# Patient Record
Sex: Female | Born: 1975
Health system: Southern US, Community
[De-identification: ages and names within clinical notes are randomized; demographics above are authoritative.]

## PROBLEM LIST (undated history)

## (undated) DIAGNOSIS — F319 Bipolar disorder, unspecified: Secondary | ICD-10-CM

## (undated) DIAGNOSIS — F909 Attention-deficit hyperactivity disorder, unspecified type: Secondary | ICD-10-CM

## (undated) DIAGNOSIS — F419 Anxiety disorder, unspecified: Secondary | ICD-10-CM

## (undated) DIAGNOSIS — Z87898 Personal history of other specified conditions: Secondary | ICD-10-CM

## (undated) DIAGNOSIS — IMO0002 Reserved for concepts with insufficient information to code with codable children: Secondary | ICD-10-CM

## (undated) HISTORY — DX: Reserved for concepts with insufficient information to code with codable children: IMO0002

## (undated) HISTORY — DX: Bipolar disorder, unspecified: F31.9

## (undated) HISTORY — DX: Personal history of other specified conditions: Z87.898

---

## 1996-07-03 HISTORY — PX: TONSILLECTOMY: SUR1361

## 1996-07-03 HISTORY — PX: THROAT SURGERY: SHX803

## 1999-07-04 DIAGNOSIS — Z87898 Personal history of other specified conditions: Secondary | ICD-10-CM

## 1999-07-04 HISTORY — DX: Personal history of other specified conditions: Z87.898

## 1999-12-30 ENCOUNTER — Other Ambulatory Visit: Admission: RE | Admit: 1999-12-30 | Discharge: 1999-12-30 | Payer: Self-pay | Admitting: Obstetrics and Gynecology

## 2000-04-24 ENCOUNTER — Other Ambulatory Visit: Admission: RE | Admit: 2000-04-24 | Discharge: 2000-04-24 | Payer: Self-pay | Admitting: Obstetrics and Gynecology

## 2000-12-31 ENCOUNTER — Emergency Department (HOSPITAL_COMMUNITY): Admission: EM | Admit: 2000-12-31 | Discharge: 2000-12-31 | Payer: Self-pay | Admitting: Emergency Medicine

## 2000-12-31 ENCOUNTER — Encounter: Payer: Self-pay | Admitting: Emergency Medicine

## 2001-01-28 ENCOUNTER — Other Ambulatory Visit: Admission: RE | Admit: 2001-01-28 | Discharge: 2001-01-28 | Payer: Self-pay | Admitting: Obstetrics and Gynecology

## 2002-02-04 ENCOUNTER — Other Ambulatory Visit: Admission: RE | Admit: 2002-02-04 | Discharge: 2002-02-04 | Payer: Self-pay | Admitting: Obstetrics and Gynecology

## 2003-03-20 ENCOUNTER — Other Ambulatory Visit: Admission: RE | Admit: 2003-03-20 | Discharge: 2003-03-20 | Payer: Self-pay | Admitting: Obstetrics and Gynecology

## 2004-04-05 ENCOUNTER — Other Ambulatory Visit: Admission: RE | Admit: 2004-04-05 | Discharge: 2004-04-05 | Payer: Self-pay | Admitting: Obstetrics and Gynecology

## 2005-03-27 ENCOUNTER — Other Ambulatory Visit: Admission: RE | Admit: 2005-03-27 | Discharge: 2005-03-27 | Payer: Self-pay | Admitting: Nurse Practitioner

## 2005-04-14 ENCOUNTER — Other Ambulatory Visit: Admission: RE | Admit: 2005-04-14 | Discharge: 2005-04-14 | Payer: Self-pay | Admitting: Obstetrics and Gynecology

## 2005-12-01 HISTORY — PX: AUGMENTATION MAMMAPLASTY: SUR837

## 2006-07-03 DIAGNOSIS — F319 Bipolar disorder, unspecified: Secondary | ICD-10-CM

## 2006-07-03 HISTORY — DX: Bipolar disorder, unspecified: F31.9

## 2006-07-19 ENCOUNTER — Other Ambulatory Visit: Admission: RE | Admit: 2006-07-19 | Discharge: 2006-07-19 | Payer: Self-pay | Admitting: Obstetrics & Gynecology

## 2006-08-16 ENCOUNTER — Ambulatory Visit: Payer: Self-pay | Admitting: Gastroenterology

## 2007-09-02 ENCOUNTER — Other Ambulatory Visit: Admission: RE | Admit: 2007-09-02 | Discharge: 2007-09-02 | Payer: Self-pay | Admitting: Obstetrics and Gynecology

## 2008-09-24 ENCOUNTER — Other Ambulatory Visit: Admission: RE | Admit: 2008-09-24 | Discharge: 2008-09-24 | Payer: Self-pay | Admitting: Obstetrics and Gynecology

## 2008-10-12 ENCOUNTER — Other Ambulatory Visit: Admission: RE | Admit: 2008-10-12 | Discharge: 2008-10-12 | Payer: Self-pay | Admitting: Obstetrics & Gynecology

## 2010-01-26 ENCOUNTER — Emergency Department (HOSPITAL_COMMUNITY): Admission: EM | Admit: 2010-01-26 | Discharge: 2010-01-26 | Payer: Self-pay | Admitting: Family Medicine

## 2010-11-18 NOTE — Assessment & Plan Note (Signed)
[facility]                         GASTROENTEROLOGY OFFICE NOTE   TYNE, BANTA                       MRN:          [id number]  DATE:08/16/2006                            DOB:          08/03/1975    REFERRING PHYSICIAN:  Dr. Nada Boozer.   REASON FOR REFERRAL:  Lower abdominal pain and diarrhea.   HISTORY OF PRESENT ILLNESS:  Ms. Linda [name] is a very nice, 35 year old  white female, who relates problems for the past 3-4 months with  recurrent episodes of non-bloody, watery, diarrhea, and lower abdominal  crampy and burning pain.  About 2 months ago she had an episode of  nausea, vomiting, and diarrhea that was attributed to an intestinal  infection and required intravenous fluids.  She states she had a CT scan  performed in [place] about 2 months ago that was unremarkable.  A  CBC, CMET, amylase and lipase from August 03, 2006 were all entirely  normal.  She states her symptoms are episodic.  She may go a few days  without a problem, and then have several days in a row with ongoing pain  and diarrhea.  Occasionally, her symptoms last only a few hours,  occasionally they last 3-4 days.  She has not noted any mucous or blood  in her stool.  She does note frequent rumbling and noises from her  abdomen.  Her symptoms are not clearly exacerbated by any particular  activity, and do not appear to worsen with meals.  She has noted  intolerance to lactose products that began about a year ago, and she has  avoided lactose products since that time.  She takes only a minimal  amount of caffeine, and a modest amount of alcohol on social occasions.  Her weight is stable, her appetite is good.  She relates no recent  treatment with antibiotics and no recent medication or dietary changes.   PAST MEDICAL HISTORY:  Negative.   PAST SURGICAL HISTORY:  Status post bilateral breast augmentation.   CURRENT MEDICATIONS:  1. Birth control pills daily.  2. Multivitamin daily.  3. Tums p.r.n.  4. Zantac 150 p.r.n.   MEDICATION ALLERGIES:  PENICILLIN.   SOCIAL HISTORY:  She is divorced with no children.  She denies any  tobacco product usage and  drinks a modest amount of alcohol on social  occasions.  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   REVIEW OF SYSTEMS:  As per the handwritten form.   PHYSICAL EXAMINATION:  Well-developed, well-nourished, slight female in  no acute distress.  Height 5 feet 6 inches, weight 124.8 pounds.  Blood  pressure is 98/64, pulse 64 and regular.  HEENT:  Anicteric sclerae, oropharynx clear.  CHEST:  Clear to auscultation bilaterally.  CARDIAC:  Regular rate and rhythm without murmurs appreciated.  ABDOMEN:  Soft with minimal lower abdominal tenderness to deep  palpation, no rebound or guarding, no palpable organomegaly, masses, or  hernias.  EXTREMITIES:  Without cyanosis, clubbing, or edema.  NEUROLOGIC:  Alert and oriented x3.  Grossly nonfocal.   ASSESSMENT AND PLAN:  Diarrhea with associated lower abdominal  pain.  Rule out inflammatory bowel disease, irritable bowel syndrome and  intestinal infections.  Will obtain records from her evaluation at  [facility], including the results of the CT scan.  Obtain stool  hemoccults.  Trial of Cipro 500 mg b.i.d., and Flagyl 500 mg b.i.d.,  both for 7 days empirically.  Robinul 1 b.i.d.  Return office visit in 2-  3 weeks for followup.  Consider further evaluation with blood work for  celiac disease serology, thyroid function, and an erythrocyte  sedimentation rate if her symptoms have not resolved.  Consider  colonoscopy as well if her symptoms have not resolved.     Venita Lick. Russella Dar, MD, [facility]  Electronically Signed    MTS/MedQ  DD: 08/16/2006  DT: 08/16/2006  Job #: [id number]

## 2011-11-03 LAB — HM PAP SMEAR: HM Pap smear: NEGATIVE

## 2012-04-23 ENCOUNTER — Emergency Department (HOSPITAL_COMMUNITY)
Admission: EM | Admit: 2012-04-23 | Discharge: 2012-04-23 | Discharge status: Against Medical Advice | Payer: Self-pay | Attending: Emergency Medicine | Admitting: Emergency Medicine

## 2012-04-23 ENCOUNTER — Encounter (HOSPITAL_COMMUNITY): Payer: Self-pay | Admitting: Cardiology

## 2012-04-23 DIAGNOSIS — R1084 Generalized abdominal pain: Secondary | ICD-10-CM | POA: Insufficient documentation

## 2012-04-23 LAB — URINALYSIS, ROUTINE W REFLEX MICROSCOPIC
Bilirubin Urine: NEGATIVE
Glucose, UA: NEGATIVE mg/dL
Hgb urine dipstick: NEGATIVE
Ketones, ur: NEGATIVE mg/dL
Leukocytes, UA: NEGATIVE
Nitrite: NEGATIVE
Protein, ur: NEGATIVE mg/dL
Specific Gravity, Urine: 1.006 (ref 1.005–1.030)
Urobilinogen, UA: 0.2 mg/dL (ref 0.0–1.0)
pH: 6.5 (ref 5.0–8.0)

## 2012-04-23 LAB — POCT PREGNANCY, URINE: Preg Test, Ur: NEGATIVE

## 2012-04-23 NOTE — ED Notes (Signed)
Reports generalized abd pain that is sharp and stabbing in nature that started this am. Denies any n/v, sob or chest pain. Denies urinary symptoms.

## 2013-03-07 ENCOUNTER — Encounter: Payer: Self-pay | Admitting: Nurse Practitioner

## 2013-03-07 ENCOUNTER — Ambulatory Visit (INDEPENDENT_AMBULATORY_CARE_PROVIDER_SITE_OTHER): Payer: BC Managed Care – PPO | Admitting: Nurse Practitioner

## 2013-03-07 VITALS — BP 100/60 | HR 68 | Resp 16 | Ht 65.0 in | Wt 145.0 lb

## 2013-03-07 DIAGNOSIS — Z01419 Encounter for gynecological examination (general) (routine) without abnormal findings: Secondary | ICD-10-CM

## 2013-03-07 DIAGNOSIS — Z0000 Encounter for general adult medical examination without abnormal findings: Secondary | ICD-10-CM

## 2013-03-07 LAB — HEMOGLOBIN, FINGERSTICK: Hemoglobin, fingerstick: 14.2 g/dL (ref 12.0–16.0)

## 2013-03-07 NOTE — Progress Notes (Signed)
Patient ID: Linda [name], female   DOB: [birth year], 37 y.o.   MRN: [id number] 37 y.o. G1P0010 Married Caucasian Fe here for annual exam.  Saw Dr Jamse Arn last year and had 4 IUI, 2 IVF. Got pregnant with 1st IVF which was considered a 'chemical' pregnancy which ended within 2-3 weeks. Currently taking the summer off. Going now to go see [facility] and saw Dr. Elesa Hacker for first appointment.  Will have 3 rd IVF later this fall.  Currently on OCP and DHEA 25 mg tid  The  AMH level was low at 1.04. Plan is to do injections and by the end of September will get egg retrieval and then another IVF. Now married for 3 years and husband is supportive.  Patient's last menstrual period was 02/28/2013.          Sexually active: yes  The current method of family planning is none.    Exercising: yes  Home exercise routine includes Home routine that includes walking, running, and workouts with husband.  also doing Pure [employer]. Smoker:  no  Health Maintenance: Pap:  11/09/11, WNL, neg HR HPV TDaP:  11/03/11 Labs: HB: 14.2  Urine: negative    reports that she has never smoked. She has never used smokeless tobacco. She reports that  drinks alcohol. She reports that she does not use illicit drugs.  Past Medical History  Diagnosis Date  . Bipolar disorder   . History of abnormal Pap smear 1/01    mild dysplasia, HPV  . Infertility     Past Surgical History  Procedure Laterality Date  . Tonsillectomy    . Augmentation mammaplasty Bilateral 6/07    implants  . Throat surgery  1998    throat absess    Current Outpatient Prescriptions  Medication Sig Dispense Refill  . clomiPHENE (CLOMID) 50 MG tablet Take 50 mg by mouth daily.      . simethicone (MYLICON) 125 MG chewable tablet Chew 125 mg by mouth every 6 (six) hours as needed. Gas pain       No current facility-administered medications for this visit.    Family History  Problem Relation Age of Onset  . Hyperlipidemia Mother   . Cancer Maternal Aunt      esophageal  . Diabetes Maternal Grandfather   . Cancer Paternal Grandmother     melanoma  . Dementia Paternal Grandmother   . Cancer Paternal Grandfather     lung    ROS:  Pertinent items are noted in HPI.  Otherwise, a comprehensive ROS was negative.  Exam:   BP 100/60  Pulse 68  Resp 16  Ht 5\' 5"  (1.651 m)  Wt 145 lb (65.772 kg)  BMI 24.13 kg/m2  LMP 02/28/2013 Height: 5\' 5"  (165.1 cm)  Ht Readings from Last 3 Encounters:  03/07/13 5\' 5"  (1.651 m)    General appearance: alert, cooperative and appears stated age Head: Normocephalic, without obvious abnormality, atraumatic Neck: no adenopathy, supple, symmetrical, trachea midline and thyroid normal to inspection and palpation Lungs: clear to auscultation bilaterally Breasts: normal appearance, no masses or tenderness Heart: regular rate and rhythm Abdomen: soft, non-tender; no masses,  no organomegaly Extremities: extremities normal, atraumatic, no cyanosis or edema Skin: Skin color, texture, turgor normal. No rashes or lesions Lymph nodes: Cervical, supraclavicular, and axillary nodes normal. No abnormal inguinal nodes palpated Neurologic: Grossly normal   Pelvic: External genitalia:  no lesions              Urethra:  normal  appearing urethra with no masses, tenderness or lesions              Bartholin's and Skene's: normal                 Vagina: normal appearing vagina with normal color and discharge, no lesions              Cervix: anteverted              Pap taken: yes per request from [facility] Bimanual Exam:  Uterus:  normal size, contour, position, consistency, mobility, non-tender              Adnexa: no mass, fullness, tenderness               Rectovaginal: Confirms               Anus:  normal sphincter tone, no lesions  A:  Well Woman with normal exam  Infertility evaluation  Remote history of dyplasia with HPV 2001 normal since.  Situational anxiety with history of Bipolar and not on  med's.  P:   Pap smear as per guidelines   Continued evaluation with infertility  She will contact them if med's are needed for anxiety  Counseled on breast self exam, adequate intake of calcium and vitamin D, diet and exercise return annually or prn  An After Visit Summary was printed and given to the patient.

## 2013-03-07 NOTE — Patient Instructions (Signed)

## 2013-03-08 NOTE — Progress Notes (Signed)
Encounter reviewed by Dr. Cyncere Sontag Silva.  

## 2013-03-11 LAB — IPS PAP TEST WITH REFLEX TO HPV

## 2013-05-08 ENCOUNTER — Other Ambulatory Visit: Payer: Self-pay

## 2013-11-18 ENCOUNTER — Telehealth: Payer: Self-pay | Admitting: Nurse Practitioner

## 2013-11-18 NOTE — Telephone Encounter (Signed)
Patient calling to speak with nurse about getting an appointment for "a lump" under her right arm in her armpit. Please advise?

## 2013-11-18 NOTE — Telephone Encounter (Signed)
Spoke with patient at time of incoming call. Patient states she has R axillary lump that has been ongoing x 1 month. Not painful or red. No trauma or bites to arm. Feels that it is increasing in size. Requests late afternoon appointment. Office visit scheduled for Milford Cage, [place] on 11/20/13 at 1615. Patient agreeable. Will call back if symptoms worsen prior to appointment.   Routing to provider for final review. Patient agreeable to disposition. Will close encounter

## 2013-11-20 ENCOUNTER — Encounter: Payer: Self-pay | Admitting: Nurse Practitioner

## 2013-11-20 ENCOUNTER — Ambulatory Visit (INDEPENDENT_AMBULATORY_CARE_PROVIDER_SITE_OTHER): Payer: BC Managed Care – PPO | Admitting: Nurse Practitioner

## 2013-11-20 ENCOUNTER — Other Ambulatory Visit (HOSPITAL_COMMUNITY): Payer: Self-pay | Admitting: Diagnostic Radiology

## 2013-11-20 VITALS — BP 100/64 | HR 60 | Ht 65.0 in | Wt 146.0 lb

## 2013-11-20 DIAGNOSIS — R599 Enlarged lymph nodes, unspecified: Secondary | ICD-10-CM

## 2013-11-20 DIAGNOSIS — R590 Localized enlarged lymph nodes: Secondary | ICD-10-CM

## 2013-11-20 LAB — CBC WITH DIFFERENTIAL/PLATELET
Basophils Absolute: 0 10*3/uL (ref 0.0–0.1)
Basophils Relative: 0 % (ref 0–1)
Eosinophils Absolute: 0.1 10*3/uL (ref 0.0–0.7)
Eosinophils Relative: 1 % (ref 0–5)
HCT: 40.9 % (ref 36.0–46.0)
Hemoglobin: 14 g/dL (ref 12.0–15.0)
Lymphocytes Relative: 23 % (ref 12–46)
Lymphs Abs: 2.3 10*3/uL (ref 0.7–4.0)
MCH: 31 pg (ref 26.0–34.0)
MCHC: 34.2 g/dL (ref 30.0–36.0)
MCV: 90.5 fL (ref 78.0–100.0)
Monocytes Absolute: 0.9 10*3/uL (ref 0.1–1.0)
Monocytes Relative: 9 % (ref 3–12)
Neutro Abs: 6.8 10*3/uL (ref 1.7–7.7)
Neutrophils Relative %: 67 % (ref 43–77)
Platelets: 261 10*3/uL (ref 150–400)
RBC: 4.52 MIL/uL (ref 3.87–5.11)
RDW: 12.6 % (ref 11.5–15.5)
WBC: 10.1 10*3/uL (ref 4.0–10.5)

## 2013-11-20 NOTE — Progress Notes (Signed)
Patient scheduled while in office for R Axillary [place] at [place] [address]. Patient agreeable to time/date/location.

## 2013-11-20 NOTE — Progress Notes (Signed)
Subjective:     Patient ID: Linda [name], female   DOB: May 30, 1976, 38 y.o.   MRN: [id number]  HPI  This 38 yo G1,P0.A1 WM Fe presents with axillary lymph node or swelling of right axilla about a month ago.  She initially thought this was related to an ingrown hair or cyst.  But no exudate or pain.  She has gradually noted that selling is still there without improvement of symptoms.  No history of trauma or bites.  No rashes and no other lymph nodes that feel enlarged.  No recent hormonal therapy to obtain a pregnancy since August 2014.  She had 2 previous IVF and 4 IUI.     Review of Systems  Constitutional: Negative.   HENT: Negative.   Respiratory: Negative.   Cardiovascular: Negative.   Genitourinary: Negative.   Musculoskeletal: Negative.   Neurological: Negative.   Psychiatric/Behavioral: Negative.        Objective:   Physical Exam  Constitutional: She is oriented to person, place, and time. She appears well-developed and well-nourished. No distress.  Abdominal: Soft.  Lymphadenopathy:       Right cervical: No superficial cervical, no deep cervical and no posterior cervical adenopathy present.      Left cervical: No superficial cervical, no deep cervical and no posterior cervical adenopathy present.    She has axillary adenopathy.       Right axillary: Pectoral adenopathy present.       Left axillary: No pectoral and no lateral adenopathy present. Right axilla with < than 1 cm mobile non tender lymph node that is high in the axilla.  No rash, no lesions, or bites.  Breast exam is normal. Without mass or nipple discharge.  Neurological: She is alert and oriented to person, place, and time.  Skin: Skin is warm and dry.  Psychiatric: She has a normal mood and affect. Her behavior is normal. Judgment and thought content normal.       Assessment:     Right Axillary lymph node    Plan:     patient was seen in consultation with Dr. Sabra Heck Will get Korea of right Axilla and  follow Will get CBC and follow.

## 2013-11-25 ENCOUNTER — Ambulatory Visit
Admission: RE | Admit: 2013-11-25 | Discharge: 2013-11-25 | Disposition: A | Discharge status: Home / Self Care | Payer: BC Managed Care – PPO | Source: Ambulatory Visit | Attending: Nurse Practitioner | Admitting: Nurse Practitioner

## 2013-11-25 ENCOUNTER — Encounter: Payer: Self-pay | Admitting: Nurse Practitioner

## 2013-11-25 DIAGNOSIS — R590 Localized enlarged lymph nodes: Secondary | ICD-10-CM

## 2013-11-25 NOTE — Progress Notes (Signed)
Reviewed personally.  M. Suzanne Mattie Novosel, MD.  

## 2013-12-10 ENCOUNTER — Telehealth: Payer: Self-pay | Admitting: Emergency Medicine

## 2013-12-10 NOTE — Telephone Encounter (Signed)
Message copied by Michele Mcalpine on Wed Dec 10, 2013  9:03 AM ------      Message from: Megan Salon      Created: Wed Dec 03, 2013  8:32 AM       Inform pt ultrasound showed one, non worrisome appearing lymph node.  Out of hold.  Needs repeat physical exam in 6 weeks. ------

## 2013-12-12 NOTE — Telephone Encounter (Signed)
Spoke with patient. She is given message from Dr. Sabra Heck and agreeable to recheck with Linda [name], [place]. Scheduled office visit for 6/29 at 1245.   Routing to provider for final review. Patient agreeable to disposition. Will close encounter

## 2013-12-12 NOTE — Telephone Encounter (Signed)
Message left to return call to [place] at [phone].   Will need follow up exam scheduled.

## 2013-12-12 NOTE — Telephone Encounter (Signed)
Patient is calling Linda [name] back

## 2013-12-29 ENCOUNTER — Encounter: Payer: Self-pay | Admitting: Nurse Practitioner

## 2013-12-29 ENCOUNTER — Ambulatory Visit (INDEPENDENT_AMBULATORY_CARE_PROVIDER_SITE_OTHER): Payer: BC Managed Care – PPO | Admitting: Nurse Practitioner

## 2013-12-29 VITALS — BP 118/70 | HR 60 | Ht 65.5 in | Wt 146.0 lb

## 2013-12-29 DIAGNOSIS — R599 Enlarged lymph nodes, unspecified: Secondary | ICD-10-CM

## 2013-12-29 NOTE — Progress Notes (Signed)
Reviewed personally.  M. Suzanne Miller, MD.  

## 2013-12-29 NOTE — Patient Instructions (Signed)
Continue to monitor axilla area and will recheck at annual.  If desires to remove wil see breast surgeon.

## 2013-12-29 NOTE — Progress Notes (Signed)
Patient ID: Linda [name], female   DOB: [birth year], 38 y.o.   MRN: [id number] S: HPI This 38 yo G1,P0.A1 WM Fe presents with axillary lymph node or swelling of right axilla for about 2 months ago. She initially thought this was related to an ingrown hair or cyst. But no exudate or pain. She came in for evaluation on 5/21 and was also examined by Dr. Sabra Heck.  We decided to get an Korea which showed a 1.1X 0.5 X 0.3 cm lymph node with fatty hilum.  This area feels about the same.  Has not gone away, but causing no pain or tenderness.  No history of trauma or bites. No rashes and no other lymph nodes that feel enlarged.  O: Right axilla without much change in the size of the lymph node. No surrounding inflammation or swelling.  No tender.  No breast mass and no other nodes.  A:   Right axillary lymph node - stable  Plan: Consulted with Dr. Sabra Heck again - if she desires this to be removed then will get breast surgeon to see her.  She feels comfortable with watchful waiting and re exam at [facility] in the fall.in the interim if any concerns to call back.

## 2014-03-11 ENCOUNTER — Ambulatory Visit: Payer: BC Managed Care – PPO | Admitting: Nurse Practitioner

## 2014-04-17 ENCOUNTER — Other Ambulatory Visit: Payer: Self-pay

## 2014-05-04 ENCOUNTER — Encounter: Payer: Self-pay | Admitting: Nurse Practitioner

## 2014-11-13 ENCOUNTER — Telehealth: Payer: Self-pay | Admitting: Nurse Practitioner

## 2014-11-13 NOTE — Telephone Encounter (Signed)
Spoke with patient. Patient states that she was seen last May for an enlarged lymph node under her right arm. Was sent for ultrasound on 11/28/2013 which was normal. Patient feels the area has increased in size and is more tender than usual. Denies any redness to the area or warmth. Area is sore to the touch. "I just want to make sure I have it looked at since it feels different now." Patient declines appointment until Friday of next week due to work schedule. Appointment scheduled for 5/20 at 9:15am with Milford Cage, [place]. Patient is agreeable to date and time. Will return call to be seen sooner if symptoms worsen or develops any new symptoms.   Routing to provider for final review. Patient agreeable to disposition. Patient aware provider will review message and nurse will return call with any additional instructions or change of disposition. Will close encounter.

## 2014-11-13 NOTE — Telephone Encounter (Signed)
Patient is a having a breast problem and has an aex 01/26/15 with [facility]. (next available) Patient wanted to wait to see [facility] for her breast problem until her aex. Patient agreed to have a nurse call her.

## 2014-11-20 ENCOUNTER — Ambulatory Visit (INDEPENDENT_AMBULATORY_CARE_PROVIDER_SITE_OTHER): Payer: BLUE CROSS/BLUE SHIELD | Admitting: Nurse Practitioner

## 2014-11-20 ENCOUNTER — Encounter: Payer: Self-pay | Admitting: Nurse Practitioner

## 2014-11-20 VITALS — BP 116/64 | HR 56 | Ht 65.0 in | Wt 150.0 lb

## 2014-11-20 DIAGNOSIS — M79621 Pain in right upper arm: Secondary | ICD-10-CM

## 2014-11-20 DIAGNOSIS — Z01419 Encounter for gynecological examination (general) (routine) without abnormal findings: Secondary | ICD-10-CM

## 2014-11-20 DIAGNOSIS — Z0000 Encounter for general adult medical examination without abnormal findings: Secondary | ICD-10-CM | POA: Diagnosis not present

## 2014-11-20 LAB — POCT URINALYSIS DIPSTICK
Bilirubin, UA: NEGATIVE
Blood, UA: NEGATIVE
Glucose, UA: NEGATIVE
Ketones, UA: NEGATIVE
Leukocytes, UA: NEGATIVE
Nitrite, UA: NEGATIVE
Protein, UA: NEGATIVE
Urobilinogen, UA: NEGATIVE
pH, UA: 5

## 2014-11-20 NOTE — Progress Notes (Signed)
Patient ID: Linda [name], female   DOB: 04-02-1976, 40 y.o.   MRN: [id number] 39 y.o. G35P0010 Married  Caucasian Fe here for annual exam.  Last saw Dr. Rolin Barry over a year ago and is not going to pursue other infertility evaluation or test as there seems to be nothing that can help.  She had 4 IUI and 2 IVS. with Dr. Carmela Rima.  The last IVS was with Dr. Rolin Barry.   Menses in regular and flow for 2-3 days. Some cramps and PMS. Still has right axilla lymph node pain since last seen here 10/2013 and 12/2013.  She is not worried but does not seem to get smaller and intermittently is more tender.  Patient's last menstrual period was 10/23/2014 (exact date).          Sexually active: Yes.    The current method of family planning is none.    Exercising: No.  The patient does not participate in regular exercise at present. Smoker:  no  Health Maintenance: Pap:  03/07/13, negative TDaP:  11/03/11 Labs:  HB:  14.1   Urine:  Negative    reports that she has never smoked. She has never used smokeless tobacco. She reports that she drinks alcohol. She reports that she does not use illicit drugs.  Past Medical History  Diagnosis Date  . Bipolar disorder 2008  . History of abnormal Pap smear 1/01    mild dysplasia, HPV  . Infertility     Past Surgical History  Procedure Laterality Date  . Tonsillectomy  1998  . Augmentation mammaplasty Bilateral 6/07    implants  . Throat surgery  1998    throat absess    No current outpatient prescriptions on file.   No current facility-administered medications for this visit.    Family History  Problem Relation Age of Onset  . Hyperlipidemia Mother   . COPD Mother   . Esophageal cancer Maternal Aunt 52    esophageal, smoker  . Diabetes Maternal Grandfather   . Cancer Paternal Grandmother     melanoma  . Dementia Paternal Grandmother   . Lung cancer Paternal Grandfather     lung    ROS:  Pertinent items are noted in HPI.  Otherwise, a comprehensive ROS was  negative.  Exam:   BP 116/64 mmHg  Pulse 56  Ht 5\' 5"  (1.651 m)  Wt 150 lb (68.04 kg)  BMI 24.96 kg/m2  LMP 10/23/2014 (Exact Date) Height: 5\' 5"  (165.1 cm) Ht Readings from Last 3 Encounters:  11/20/14 5\' 5"  (1.651 m)  12/29/13 5' 5.5" (1.664 m)  11/20/13 5\' 5"  (1.651 m)    General appearance: alert, cooperative and appears stated age Head: Normocephalic, without obvious abnormality, atraumatic Neck: no adenopathy, supple, symmetrical, trachea midline and thyroid normal to inspection and palpation Lungs: clear to auscultation bilaterally Breasts: normal appearance, no masses or tenderness, positive findings: implants bilaterally and tender lymph node right axilla that is unchanged < 1cm high in the axilla.  No exudat or redness.  Heart: regular rate and rhythm Abdomen: soft, non-tender; no masses,  no organomegaly Extremities: extremities normal, atraumatic, no cyanosis or edema Skin: Skin color, texture, turgor normal. No rashes or lesions Lymph nodes: Cervical, supraclavicular, and axillary nodes normal. No abnormal inguinal nodes palpated Neurologic: Grossly normal   Pelvic: External genitalia:  no lesions              Urethra:  normal appearing urethra with no masses, tenderness or lesions  Bartholin's and Skene's: normal                 Vagina: normal appearing vagina with normal color and discharge, no lesions              Cervix: anteverted              Pap taken: Yes.   Bimanual Exam:  Uterus:  normal size, contour, position, consistency, mobility, non-tender              Adnexa: no mass, fullness, tenderness               Rectovaginal: Confirms               Anus:  normal sphincter tone, no lesions  Chaperone present:  yes  A:  Well Woman with normal exam  Infertility evaluation - now ended Remote history of dyplasia with HPV 2001 normal since. Situational anxiety with history of Bipolar and not on med's  Right axilla  lymphadenopathy persistent    P:   Reviewed health and wellness pertinent to exam  Pap smear as above  Mammogram - will get diagnostic Mammo 3 D and Korea of right axilla - scheduled for  11/25/14 at the [facility]  It has been discussed that she may want to see a breast surgeon for removal.  Counseled on breast self exam, mammography screening, adequate intake of calcium and vitamin D, diet and exercise return annually or prn  An After Visit Summary was printed and given to the patient.

## 2014-11-20 NOTE — Progress Notes (Signed)
Patient is scheduled for Bilateral 3d Breast Diagnostic Mammogram and R Breast Ultrasound at [facility] on 11/25/14 at 0900 . Patient agreeable to time/date/location.

## 2014-11-20 NOTE — Patient Instructions (Signed)

## 2014-11-22 NOTE — Progress Notes (Signed)
Encounter reviewed by Dr. Brook Silva.  

## 2014-11-23 LAB — HEMOGLOBIN, FINGERSTICK: Hemoglobin, fingerstick: 14.1 g/dL (ref 12.0–16.0)

## 2014-11-25 ENCOUNTER — Ambulatory Visit
Admission: RE | Admit: 2014-11-25 | Discharge: 2014-11-25 | Disposition: A | Discharge status: Home / Self Care | Payer: BLUE CROSS/BLUE SHIELD | Source: Ambulatory Visit | Attending: Nurse Practitioner | Admitting: Nurse Practitioner

## 2014-11-25 ENCOUNTER — Other Ambulatory Visit: Payer: Self-pay | Admitting: Nurse Practitioner

## 2014-11-25 DIAGNOSIS — N6320 Unspecified lump in the left breast, unspecified quadrant: Secondary | ICD-10-CM

## 2014-11-25 DIAGNOSIS — M79621 Pain in right upper arm: Secondary | ICD-10-CM

## 2014-11-25 LAB — IPS PAP TEST WITH HPV

## 2015-01-26 ENCOUNTER — Ambulatory Visit: Payer: Self-pay | Admitting: Nurse Practitioner

## 2015-06-07 HISTORY — PX: OTHER SURGICAL HISTORY: SHX169

## 2015-06-09 ENCOUNTER — Other Ambulatory Visit: Payer: Self-pay

## 2015-06-09 ENCOUNTER — Ambulatory Visit (INDEPENDENT_AMBULATORY_CARE_PROVIDER_SITE_OTHER): Payer: BLUE CROSS/BLUE SHIELD | Admitting: Obstetrics and Gynecology

## 2015-06-09 ENCOUNTER — Encounter: Payer: Self-pay | Admitting: Obstetrics and Gynecology

## 2015-06-09 ENCOUNTER — Other Ambulatory Visit: Payer: Self-pay | Admitting: Obstetrics and Gynecology

## 2015-06-09 VITALS — BP 100/68 | HR 76 | Resp 18 | Ht 65.0 in | Wt 147.0 lb

## 2015-06-09 DIAGNOSIS — R2231 Localized swelling, mass and lump, right upper limb: Secondary | ICD-10-CM

## 2015-06-09 DIAGNOSIS — N63 Unspecified lump in unspecified breast: Secondary | ICD-10-CM

## 2015-06-09 NOTE — Progress Notes (Signed)
Scheduled patient while in office for left breast diagnostic mammogram and [facility] on [facility] at 1:30 pm. Patient expects to know if she is pregnant by 06/15/2015. The [facility] states they have to schedule the follow up diagnostic with the right axillary ultrasound and can not schedule these separate. [facility] is aware if the patient has positive pregnancy test on Tuesday she will NOT be able to have diagnostic imaging. Per [facility] if patient is pregnant diagnotic imaging will be cancelled and only the ultrasound will be performed.

## 2015-06-09 NOTE — Progress Notes (Signed)
GYNECOLOGY  VISIT   HPI: 39 y.o.   Married  caucasian  female   G1P0010 with Patient's last menstrual period was 04/17/2015 (approximate).   here for  Complaints of lump under right axillary. The patient just underwent an IVF transfer 2 days ago with a donor egg. She is on estradiol oral and transdermal and progesterone.  She has c/o a tender lump in her right axilla in the past, negative imaging in 5/16. She is due for a f/u diagnostic imaging of the left breast now.  The axillary lump has been there for over a year, feels it quadrupled in size in the last few weeks. More tender.   GYNECOLOGIC HISTORY: Patient's last menstrual period was 04/17/2015 (approximate). Contraception:None Menopausal hormone therapy: None Last mammogram: 11/25/2014 BIRADS category 3: probably benign Last pap smear: 11/20/2014 Normal        OB History    Gravida Para Term Preterm AB TAB SAB Ectopic Multiple Living   1 0 0  1  1            There are no active problems to display for this patient.   Past Medical History  Diagnosis Date  . Bipolar disorder ([place]) 2008  . History of abnormal Pap smear 1/01    mild dysplasia, HPV  . Infertility     Past Surgical History  Procedure Laterality Date  . Tonsillectomy  1998  . Augmentation mammaplasty Bilateral 6/07    implants  . Throat surgery  1998    throat absess  . Ivf transfer 06/07/2015 N/A 06/07/2015    Current Outpatient Prescriptions  Medication Sig Dispense Refill  . estradiol (ESTRACE) 2 MG tablet Take 2 mg by mouth.    . estradiol (VIVELLE-DOT) 0.1 MG/24HR patch Place 1 patch onto the skin.    . progesterone 50 MG/ML injection Inject into the muscle.     No current facility-administered medications for this visit.     ALLERGIES: Penicillins  Family History  Problem Relation Age of Onset  . Hyperlipidemia Mother   . COPD Mother   . Esophageal cancer Maternal Aunt 52    esophageal, smoker  . Diabetes Maternal Grandfather   .  Cancer Paternal Grandmother     melanoma  . Dementia Paternal Grandmother   . Lung cancer Paternal Grandfather     lung    Social History   Social History  . Marital Status: Married    Spouse Name: N/A  . Number of Children: 0  . Years of Education: N/A   Occupational History  . Not on file.   Social History Main Topics  . Smoking status: Never Smoker   . Smokeless tobacco: Never Used  . Alcohol Use: Yes     Comment: social  . Drug Use: No  . Sexual Activity:    Partners: Male    Birth Control/ Protection: None   Other Topics Concern  . Not on file   Social History Narrative    ROS:  Pertinent items are noted in HPI.  PHYSICAL EXAMINATION:    BP 100/68 mmHg  Pulse 76  Resp 18  Ht 5\' 5"  (1.651 m)  Wt 147 lb (66.679 kg)  BMI 24.46 kg/m2  LMP 04/17/2015 (Approximate)    General appearance: alert, cooperative and appears stated age Head: Normocephalic, without obvious abnormality, atraumatic Breasts: Bilateral breast implants, no breast lumps. She is tender in the right axilla, area in question, not clearly a node vs muscle/tendon. Similar area on the left  ASSESSMENT Right axillary lump/tender On hormones for recent IVF donor egg Overdue for a diagnostic f/u left mammogram  PLAN U/s right axilla If she is not pregnant she should call to set up her diagnostic left breast imaging.   An After Visit Summary was printed and given to the patient.

## 2015-06-16 ENCOUNTER — Other Ambulatory Visit: Payer: BLUE CROSS/BLUE SHIELD

## 2015-06-22 ENCOUNTER — Other Ambulatory Visit: Payer: BLUE CROSS/BLUE SHIELD

## 2015-06-24 ENCOUNTER — Ambulatory Visit
Admission: RE | Admit: 2015-06-24 | Discharge: 2015-06-24 | Disposition: A | Discharge status: Home / Self Care | Payer: BLUE CROSS/BLUE SHIELD | Source: Ambulatory Visit | Attending: Obstetrics and Gynecology | Admitting: Obstetrics and Gynecology

## 2015-06-24 ENCOUNTER — Other Ambulatory Visit: Payer: Self-pay | Admitting: Obstetrics and Gynecology

## 2015-06-24 DIAGNOSIS — R2231 Localized swelling, mass and lump, right upper limb: Secondary | ICD-10-CM

## 2015-06-24 DIAGNOSIS — N63 Unspecified lump in unspecified breast: Secondary | ICD-10-CM

## 2015-07-30 LAB — OB RESULTS CONSOLE ABO/RH: RH Type: POSITIVE

## 2015-07-30 LAB — OB RESULTS CONSOLE GC/CHLAMYDIA
Chlamydia: NEGATIVE
Gonorrhea: NEGATIVE

## 2015-07-30 LAB — OB RESULTS CONSOLE RUBELLA ANTIBODY, IGM: Rubella: IMMUNE

## 2015-07-30 LAB — OB RESULTS CONSOLE HEPATITIS B SURFACE ANTIGEN: Hepatitis B Surface Ag: NEGATIVE

## 2015-07-30 LAB — OB RESULTS CONSOLE HIV ANTIBODY (ROUTINE TESTING): HIV: NONREACTIVE

## 2015-07-30 LAB — OB RESULTS CONSOLE ANTIBODY SCREEN: Antibody Screen: NEGATIVE

## 2015-10-11 DIAGNOSIS — R1011 Right upper quadrant pain: Secondary | ICD-10-CM | POA: Diagnosis not present

## 2015-10-12 ENCOUNTER — Other Ambulatory Visit: Payer: Self-pay | Admitting: Obstetrics and Gynecology

## 2015-10-12 DIAGNOSIS — R1011 Right upper quadrant pain: Secondary | ICD-10-CM

## 2015-10-13 ENCOUNTER — Ambulatory Visit
Admission: RE | Admit: 2015-10-13 | Discharge: 2015-10-13 | Disposition: A | Discharge status: Home / Self Care | Payer: BLUE CROSS/BLUE SHIELD | Source: Ambulatory Visit | Attending: Obstetrics and Gynecology | Admitting: Obstetrics and Gynecology

## 2015-10-13 ENCOUNTER — Other Ambulatory Visit: Payer: Self-pay | Admitting: Obstetrics and Gynecology

## 2015-10-13 DIAGNOSIS — R1011 Right upper quadrant pain: Secondary | ICD-10-CM

## 2015-10-13 DIAGNOSIS — N1330 Unspecified hydronephrosis: Secondary | ICD-10-CM | POA: Diagnosis not present

## 2015-10-22 DIAGNOSIS — H00011 Hordeolum externum right upper eyelid: Secondary | ICD-10-CM | POA: Diagnosis not present

## 2015-10-22 DIAGNOSIS — H1045 Other chronic allergic conjunctivitis: Secondary | ICD-10-CM | POA: Diagnosis not present

## 2015-11-01 DIAGNOSIS — Z3A23 23 weeks gestation of pregnancy: Secondary | ICD-10-CM | POA: Diagnosis not present

## 2015-11-01 DIAGNOSIS — O09512 Supervision of elderly primigravida, second trimester: Secondary | ICD-10-CM | POA: Diagnosis not present

## 2015-11-01 DIAGNOSIS — O26899 Other specified pregnancy related conditions, unspecified trimester: Secondary | ICD-10-CM | POA: Diagnosis not present

## 2015-12-03 DIAGNOSIS — Z36 Encounter for antenatal screening of mother: Secondary | ICD-10-CM | POA: Diagnosis not present

## 2015-12-03 DIAGNOSIS — O4443 Low lying placenta NOS or without hemorrhage, third trimester: Secondary | ICD-10-CM | POA: Diagnosis not present

## 2015-12-03 DIAGNOSIS — Z3A28 28 weeks gestation of pregnancy: Secondary | ICD-10-CM | POA: Diagnosis not present

## 2015-12-15 DIAGNOSIS — Z23 Encounter for immunization: Secondary | ICD-10-CM | POA: Diagnosis not present

## 2015-12-29 DIAGNOSIS — O4443 Low lying placenta NOS or without hemorrhage, third trimester: Secondary | ICD-10-CM | POA: Diagnosis not present

## 2015-12-29 DIAGNOSIS — Z3A32 32 weeks gestation of pregnancy: Secondary | ICD-10-CM | POA: Diagnosis not present

## 2015-12-29 DIAGNOSIS — O321XX0 Maternal care for breech presentation, not applicable or unspecified: Secondary | ICD-10-CM | POA: Diagnosis not present

## 2016-01-12 ENCOUNTER — Encounter (HOSPITAL_COMMUNITY): Payer: Self-pay | Admitting: *Deleted

## 2016-01-12 ENCOUNTER — Inpatient Hospital Stay (HOSPITAL_COMMUNITY)
Admission: AD | Admit: 2016-01-12 | Discharge: 2016-01-12 | Disposition: A | Discharge status: Home / Self Care | Payer: BLUE CROSS/BLUE SHIELD | Source: Ambulatory Visit | Attending: Obstetrics and Gynecology | Admitting: Obstetrics and Gynecology

## 2016-01-12 DIAGNOSIS — O133 Gestational [pregnancy-induced] hypertension without significant proteinuria, third trimester: Secondary | ICD-10-CM | POA: Diagnosis not present

## 2016-01-12 DIAGNOSIS — O1203 Gestational edema, third trimester: Secondary | ICD-10-CM | POA: Diagnosis not present

## 2016-01-12 DIAGNOSIS — Z3A34 34 weeks gestation of pregnancy: Secondary | ICD-10-CM | POA: Insufficient documentation

## 2016-01-12 DIAGNOSIS — F319 Bipolar disorder, unspecified: Secondary | ICD-10-CM | POA: Diagnosis not present

## 2016-01-12 DIAGNOSIS — R030 Elevated blood-pressure reading, without diagnosis of hypertension: Secondary | ICD-10-CM | POA: Diagnosis not present

## 2016-01-12 DIAGNOSIS — O99343 Other mental disorders complicating pregnancy, third trimester: Secondary | ICD-10-CM | POA: Diagnosis not present

## 2016-01-12 DIAGNOSIS — I10 Essential (primary) hypertension: Secondary | ICD-10-CM | POA: Diagnosis present

## 2016-01-12 LAB — COMPREHENSIVE METABOLIC PANEL
ALT: 20 U/L (ref 14–54)
AST: 23 U/L (ref 15–41)
Albumin: 3.2 g/dL — ABNORMAL LOW (ref 3.5–5.0)
Alkaline Phosphatase: 107 U/L (ref 38–126)
Anion gap: 8 (ref 5–15)
BUN: 8 mg/dL (ref 6–20)
CO2: 23 mmol/L (ref 22–32)
Calcium: 8.9 mg/dL (ref 8.9–10.3)
Chloride: 105 mmol/L (ref 101–111)
Creatinine, Ser: 0.64 mg/dL (ref 0.44–1.00)
GFR calc Af Amer: >60 mL/min (ref >60)
GFR calc non Af Amer: >60 mL/min (ref >60)
Glucose, Bld: 87 mg/dL (ref 65–99)
Potassium: 3.8 mmol/L (ref 3.5–5.1)
Sodium: 136 mmol/L (ref 135–145)
Total Bilirubin: 0.6 mg/dL (ref 0.3–1.2)
Total Protein: 6.8 g/dL (ref 6.5–8.1)

## 2016-01-12 LAB — CBC
HCT: 34.9 % — ABNORMAL LOW (ref 36.0–46.0)
Hemoglobin: 11.7 g/dL — ABNORMAL LOW (ref 12.0–15.0)
MCH: 29.5 pg (ref 26.0–34.0)
MCHC: 33.5 g/dL (ref 30.0–36.0)
MCV: 88.1 fL (ref 78.0–100.0)
Platelets: 247 10*3/uL (ref 150–400)
RBC: 3.96 MIL/uL (ref 3.87–5.11)
RDW: 12.9 % (ref 11.5–15.5)
WBC: 13.2 10*3/uL — ABNORMAL HIGH (ref 4.0–10.5)

## 2016-01-12 LAB — PROTEIN / CREATININE RATIO, URINE
Creatinine, Urine: 147 mg/dL
Protein Creatinine Ratio: 0.11 mg/mg{Cre} (ref 0.00–0.15)
Total Protein, Urine: 16 mg/dL

## 2016-01-12 NOTE — MAU Provider Note (Signed)
History    Chief Complaint  Patient presents with  . Hypertension   HPI:   40 yo G2P0010 MWF @ [redacted] weeks gestation sent from office for serial BP, PIH labs due to increased weight  Gain with associated leg swelling , BP 138/90 and heartburn not responsive prescribed med. Pt denies h/a, visual  Changes  OB History    Gravida Para Term Preterm AB TAB SAB Ectopic Multiple Living   2 0 0  1  1         Past Medical History  Diagnosis Date  . Bipolar disorder ([place]) 2008  . History of abnormal Pap smear 1/01    mild dysplasia, HPV  . Infertility     Past Surgical History  Procedure Laterality Date  . Tonsillectomy  1998  . Augmentation mammaplasty Bilateral 6/07    implants  . Throat surgery  1998    throat absess  . Ivf transfer 06/07/2015 N/A 06/07/2015    Family History  Problem Relation Age of Onset  . Hyperlipidemia Mother   . COPD Mother   . Esophageal cancer Maternal Aunt 52    esophageal, smoker  . Diabetes Maternal Grandfather   . Cancer Paternal Grandmother     melanoma  . Dementia Paternal Grandmother   . Lung cancer Paternal Grandfather     lung    Social History  Substance Use Topics  . Smoking status: Never Smoker   . Smokeless tobacco: Never Used  . Alcohol Use: Yes     Comment: social    Allergies:  Allergies  Allergen Reactions  . Penicillins Hives    Prescriptions prior to admission  Medication Sig Dispense Refill Last Dose  . estradiol (ESTRACE) 2 MG tablet Take 2 mg by mouth.     . estradiol (VIVELLE-DOT) 0.1 MG/24HR patch Place 1 patch onto the skin.     . progesterone 50 MG/ML injection Inject into the muscle.        Physical Exam   Blood pressure 132/86, pulse 95, temperature 98.2 F (36.8 C), temperature source Oral, resp. rate 18, height 5\' 5"  (1.651 m), weight 81.194 kg (179 lb), last menstrual period 04/17/2015. No data found.  Filed Vitals:   01/12/16 1753 01/12/16 1807 01/12/16 1811 01/12/16 1854  BP: 123/85 139/88  132/86 130/89  Pulse: 105 95  89  Temp: 98.2 F (36.8 C)     TempSrc: Oral     Resp: 18   16  Height:  5\' 5"  (1.651 m)    Weight:  81.194 kg (179 lb)     No exam performed today, done in office. CBC    Component Value Date/Time   WBC 13.2* 01/12/2016 1735   RBC 3.96 01/12/2016 1735   HGB 11.7* 01/12/2016 1735   HGB 14.1 11/20/2014 1336   HCT 34.9* 01/12/2016 1735   PLT 247 01/12/2016 1735   MCV 88.1 01/12/2016 1735   MCH 29.5 01/12/2016 1735   MCHC 33.5 01/12/2016 1735   RDW 12.9 01/12/2016 1735   LYMPHSABS 2.3 11/20/2013 1645   MONOABS 0.9 11/20/2013 1645   EOSABS 0.1 11/20/2013 1645   BASOSABS 0.0 11/20/2013 1645    CMP     Component Value Date/Time   NA 136 01/12/2016 1735   K 3.8 01/12/2016 1735   CL 105 01/12/2016 1735   CO2 23 01/12/2016 1735   GLUCOSE 87 01/12/2016 1735   BUN 8 01/12/2016 1735   CREATININE 0.64 01/12/2016 1735   CALCIUM 8.9 01/12/2016 1735  PROT 6.8 01/12/2016 1735   ALBUMIN 3.2* 01/12/2016 1735   AST 23 01/12/2016 1735   ALT 20 01/12/2016 1735   ALKPHOS 107 01/12/2016 1735   BILITOT 0.6 01/12/2016 1735   GFRNONAA >60 01/12/2016 1735   GFRAA >60 01/12/2016 1735   Protein creatinine ratio: 0.11 Tracing: baseline 150 (+) accel 170 No ctx  ED Course  IMP:   Transient Gestational HTN No evidence of preeclampsia IUP @ 34 weeks P) d/c home. PIH warning signs. F/u 1 week. Decrease wk hours 6/d MDM   Marvene Staff, MD 6:39 PM 01/12/2016

## 2016-01-12 NOTE — Discharge Instructions (Signed)

## 2016-01-12 NOTE — MAU Note (Signed)
Pt sent from MD office for elevated BP.  Denies HA, visual changes or epigastric pain.  Has back pain, denies bleeding or LOF.

## 2016-01-17 ENCOUNTER — Telehealth: Payer: Self-pay | Admitting: *Deleted

## 2016-01-17 NOTE — Telephone Encounter (Signed)
You can remove from recall as she has transferred care.  Thanks.

## 2016-01-17 NOTE — Telephone Encounter (Signed)
Patient removed from recall -eh  

## 2016-01-17 NOTE — Telephone Encounter (Signed)
Dr. Sabra Heck, This patient is in 04 recall for 12/2015 -She is currently [redacted] weeks pregnant. Should I extend the recall until after she delivers?  Please advise Thanks Margaretha Sheffield

## 2016-01-19 DIAGNOSIS — Z3A35 35 weeks gestation of pregnancy: Secondary | ICD-10-CM | POA: Diagnosis not present

## 2016-01-19 DIAGNOSIS — Z36 Encounter for antenatal screening of mother: Secondary | ICD-10-CM | POA: Diagnosis not present

## 2016-01-19 DIAGNOSIS — O321XX3 Maternal care for breech presentation, fetus 3: Secondary | ICD-10-CM | POA: Diagnosis not present

## 2016-01-26 ENCOUNTER — Other Ambulatory Visit: Payer: Self-pay | Admitting: Obstetrics and Gynecology

## 2016-01-26 DIAGNOSIS — O321XX3 Maternal care for breech presentation, fetus 3: Secondary | ICD-10-CM | POA: Diagnosis not present

## 2016-01-26 DIAGNOSIS — Z3A36 36 weeks gestation of pregnancy: Secondary | ICD-10-CM | POA: Diagnosis not present

## 2016-01-29 LAB — OB RESULTS CONSOLE GBS: GBS: NEGATIVE

## 2016-02-01 ENCOUNTER — Inpatient Hospital Stay (HOSPITAL_COMMUNITY): Payer: BLUE CROSS/BLUE SHIELD

## 2016-02-01 ENCOUNTER — Encounter (HOSPITAL_COMMUNITY): Payer: Self-pay | Admitting: *Deleted

## 2016-02-01 ENCOUNTER — Other Ambulatory Visit: Payer: Self-pay | Admitting: Obstetrics and Gynecology

## 2016-02-01 ENCOUNTER — Inpatient Hospital Stay (HOSPITAL_COMMUNITY)
Admission: AD | Admit: 2016-02-01 | Discharge: 2016-02-01 | Disposition: A | Discharge status: Home / Self Care | Payer: BLUE CROSS/BLUE SHIELD | Source: Ambulatory Visit | Attending: Obstetrics and Gynecology | Admitting: Obstetrics and Gynecology

## 2016-02-01 DIAGNOSIS — O321XX0 Maternal care for breech presentation, not applicable or unspecified: Secondary | ICD-10-CM | POA: Diagnosis not present

## 2016-02-01 DIAGNOSIS — R12 Heartburn: Secondary | ICD-10-CM | POA: Diagnosis not present

## 2016-02-01 DIAGNOSIS — Z833 Family history of diabetes mellitus: Secondary | ICD-10-CM | POA: Diagnosis not present

## 2016-02-01 DIAGNOSIS — Z801 Family history of malignant neoplasm of trachea, bronchus and lung: Secondary | ICD-10-CM | POA: Diagnosis not present

## 2016-02-01 DIAGNOSIS — O3413 Maternal care for benign tumor of corpus uteri, third trimester: Secondary | ICD-10-CM | POA: Diagnosis not present

## 2016-02-01 DIAGNOSIS — Z36 Encounter for antenatal screening of mother: Secondary | ICD-10-CM | POA: Diagnosis not present

## 2016-02-01 DIAGNOSIS — O1494 Unspecified pre-eclampsia, complicating childbirth: Secondary | ICD-10-CM | POA: Diagnosis not present

## 2016-02-01 DIAGNOSIS — O1493 Unspecified pre-eclampsia, third trimester: Secondary | ICD-10-CM | POA: Diagnosis not present

## 2016-02-01 DIAGNOSIS — Z3A36 36 weeks gestation of pregnancy: Secondary | ICD-10-CM

## 2016-02-01 DIAGNOSIS — O321XX1 Maternal care for breech presentation, fetus 1: Secondary | ICD-10-CM | POA: Diagnosis not present

## 2016-02-01 DIAGNOSIS — D259 Leiomyoma of uterus, unspecified: Secondary | ICD-10-CM | POA: Diagnosis not present

## 2016-02-01 DIAGNOSIS — Z800 Family history of malignant neoplasm of digestive organs: Secondary | ICD-10-CM | POA: Diagnosis not present

## 2016-02-01 DIAGNOSIS — Z825 Family history of asthma and other chronic lower respiratory diseases: Secondary | ICD-10-CM | POA: Diagnosis not present

## 2016-02-01 DIAGNOSIS — O99344 Other mental disorders complicating childbirth: Secondary | ICD-10-CM | POA: Diagnosis not present

## 2016-02-01 DIAGNOSIS — F319 Bipolar disorder, unspecified: Secondary | ICD-10-CM | POA: Diagnosis not present

## 2016-02-01 DIAGNOSIS — O1203 Gestational edema, third trimester: Secondary | ICD-10-CM | POA: Diagnosis not present

## 2016-02-01 DIAGNOSIS — Z3A37 37 weeks gestation of pregnancy: Secondary | ICD-10-CM | POA: Diagnosis not present

## 2016-02-01 DIAGNOSIS — O368390 Maternal care for abnormalities of the fetal heart rate or rhythm, unspecified trimester, not applicable or unspecified: Secondary | ICD-10-CM

## 2016-02-01 DIAGNOSIS — H538 Other visual disturbances: Secondary | ICD-10-CM | POA: Diagnosis not present

## 2016-02-01 LAB — COMPREHENSIVE METABOLIC PANEL
ALT: 20 U/L (ref 14–54)
AST: 25 U/L (ref 15–41)
Albumin: 3 g/dL — ABNORMAL LOW (ref 3.5–5.0)
Alkaline Phosphatase: 135 U/L — ABNORMAL HIGH (ref 38–126)
Anion gap: 8 (ref 5–15)
BUN: 8 mg/dL (ref 6–20)
CO2: 22 mmol/L (ref 22–32)
Calcium: 8.9 mg/dL (ref 8.9–10.3)
Chloride: 106 mmol/L (ref 101–111)
Creatinine, Ser: 0.61 mg/dL (ref 0.44–1.00)
GFR calc Af Amer: >60 mL/min (ref >60)
GFR calc non Af Amer: >60 mL/min (ref >60)
Glucose, Bld: 94 mg/dL (ref 65–99)
Potassium: 3.6 mmol/L (ref 3.5–5.1)
Sodium: 136 mmol/L (ref 135–145)
Total Bilirubin: 0.4 mg/dL (ref 0.3–1.2)
Total Protein: 6.3 g/dL — ABNORMAL LOW (ref 6.5–8.1)

## 2016-02-01 LAB — PROTEIN / CREATININE RATIO, URINE
Creatinine, Urine: 44 mg/dL
Total Protein, Urine: <6 mg/dL

## 2016-02-01 LAB — CBC
HCT: 32.8 % — ABNORMAL LOW (ref 36.0–46.0)
Hemoglobin: 10.9 g/dL — ABNORMAL LOW (ref 12.0–15.0)
MCH: 29.1 pg (ref 26.0–34.0)
MCHC: 33.2 g/dL (ref 30.0–36.0)
MCV: 87.5 fL (ref 78.0–100.0)
Platelets: 191 10*3/uL (ref 150–400)
RBC: 3.75 MIL/uL — ABNORMAL LOW (ref 3.87–5.11)
RDW: 13.2 % (ref 11.5–15.5)
WBC: 12.3 10*3/uL — ABNORMAL HIGH (ref 4.0–10.5)

## 2016-02-01 LAB — URIC ACID: Uric Acid, Serum: 5.4 mg/dL (ref 2.3–6.6)

## 2016-02-01 NOTE — MAU Provider Note (Signed)
History     Chief Complaint  Patient presents with  . Leg Swelling  . Blurred Vision   40 yo G2P0010 MWF @ 36 6/[redacted] weeks gestation sent from the office  For preeclampsia evaluation due to c/o increased leg swelling and Blurred vision. Pt has ongoing heartburn despite protonix.  (+) FM  OB History    Gravida Para Term Preterm AB Living   2 0 0   1     SAB TAB Ectopic Multiple Live Births   1              Past Medical History:  Diagnosis Date  . Bipolar disorder ([place]) 2008  . History of abnormal Pap smear 1/01   mild dysplasia, HPV  . Infertility     Past Surgical History:  Procedure Laterality Date  . AUGMENTATION MAMMAPLASTY Bilateral 6/07   implants  . IVF Transfer 06/07/2015 N/A 06/07/2015  . THROAT SURGERY  1998   throat absess  . TONSILLECTOMY  1998    Family History  Problem Relation Age of Onset  . Hyperlipidemia Mother   . COPD Mother   . Esophageal cancer Maternal Aunt 52    esophageal, smoker  . Diabetes Maternal Grandfather   . Cancer Paternal Grandmother     melanoma  . Dementia Paternal Grandmother   . Lung cancer Paternal Grandfather     lung    Social History  Substance Use Topics  . Smoking status: Never Smoker  . Smokeless tobacco: Never Used  . Alcohol use Yes     Comment: social    Allergies:  Allergies  Allergen Reactions  . Penicillins Hives    Has patient had a PCN reaction causing immediate rash, facial/tongue/throat swelling, SOB or lightheadedness with hypotension: Yes Has patient had a PCN reaction causing severe rash involving mucus membranes or skin necrosis: No Has patient had a PCN reaction that required hospitalization No Has patient had a PCN reaction occurring within the last 10 years: No If all of the above answers are "NO", then may proceed with Cephalosporin use.     Prescriptions Prior to Admission  Medication Sig Dispense Refill Last Dose  . acetaminophen (TYLENOL) 325 MG tablet Take 650 mg by mouth every 6  (six) hours as needed for mild pain.   Past Week at Unknown time  . calcium carbonate (TUMS - DOSED IN MG ELEMENTAL CALCIUM) 500 MG chewable tablet Chew 2 tablets by mouth 3 (three) times daily as needed for indigestion or heartburn.   01/31/2016 at Unknown time  . esomeprazole (NEXIUM) 40 MG capsule Take 40 mg by mouth daily at 12 noon.   01/31/2016 at Unknown time  . Prenatal Vit-Fe Fumarate-FA (PRENATAL MULTIVITAMIN) TABS tablet Take 1 tablet by mouth daily at 12 noon.   02/01/2016 at Unknown time  . zolpidem (AMBIEN) 5 MG tablet Take 5 mg by mouth at bedtime as needed for sleep.   01/31/2016 at Unknown time     Physical Exam   Blood pressure 129/78, pulse 84, temperature 98.7 F (37.1 C), temperature source Oral, resp. rate 18, last menstrual period 04/17/2015. Patient Vitals for the past 24 hrs:  BP Temp Temp src Pulse Resp  02/01/16 1702 129/78 - - 84 -  02/01/16 1646 130/84 - - 82 -  02/01/16 1632 131/85 - - 89 -  02/01/16 1617 141/87 - - 78 -  02/01/16 1609 143/92 - - 87 -  02/01/16 1558 143/92 98.7 F (37.1 C) Oral - 18  No exam performed today, done in office.  CBC Latest Ref Rng & Units 02/01/2016 01/12/2016 11/20/2014  WBC 4.0 - 10.5 K/uL 12.3(H) 13.2(H) -  Hemoglobin 12.0 - 15.0 g/dL 10.9(L) 11.7(L) 14.1  Hematocrit 36.0 - 46.0 % 32.8(L) 34.9(L) -  Platelets 150 - 400 K/uL 191 247 -    CMP Latest Ref Rng & Units 02/01/2016 01/12/2016  Glucose 65 - 99 mg/dL 94 87  BUN 6 - 20 mg/dL 8 8  Creatinine 0.44 - 1.00 mg/dL 0.61 0.64  Sodium 135 - 145 mmol/L 136 136  Potassium 3.5 - 5.1 mmol/L 3.6 3.8  Chloride 101 - 111 mmol/L 106 105  CO2 22 - 32 mmol/L 22 23  Calcium 8.9 - 10.3 mg/dL 8.9 8.9  Total Protein 6.5 - 8.1 g/dL 6.3(L) 6.8  Total Bilirubin 0.3 - 1.2 mg/dL 0.4 0.6  Alkaline Phos 38 - 126 U/L 135(H) 107  AST 15 - 41 U/L 25 23  ALT 14 - 54 U/L 20 20  Tracing: baseline 150 (+) accels  (+) variables decels noted at end of eval   IMP: Leg swelling no evidence of  preeclampsia iUP @ 36 6/7 weeks Breech presentation P) BPP . d/c home . Oow. Preeclampsia warnings signs.  ED Course   MDM  Addendum: BPP 8/8.  Jenel Gierke A, MD 6:20 PM 02/01/2016

## 2016-02-01 NOTE — Discharge Instructions (Signed)

## 2016-02-01 NOTE — MAU Note (Signed)
Notified provider that patient's labs are resulted.

## 2016-02-01 NOTE — MAU Note (Addendum)
Had MD appointment today, feet are very swollen, began having blurred vision & seeing spots this a.m., denies HA.  Sent to MAU from office.  Feeling SOB @ times.  Having occasional uc's, denies bleeding or LOF.  Baby is breech, has scheduled C/S.

## 2016-02-03 ENCOUNTER — Inpatient Hospital Stay (HOSPITAL_COMMUNITY)
Admission: AD | Admit: 2016-02-03 | Discharge: 2016-02-07 | DRG: 766 | Disposition: A | Discharge status: Home / Self Care | Payer: BLUE CROSS/BLUE SHIELD | Source: Ambulatory Visit | Attending: Obstetrics and Gynecology | Admitting: Obstetrics and Gynecology

## 2016-02-03 ENCOUNTER — Encounter (HOSPITAL_COMMUNITY): Payer: Self-pay | Admitting: *Deleted

## 2016-02-03 DIAGNOSIS — Z833 Family history of diabetes mellitus: Secondary | ICD-10-CM

## 2016-02-03 DIAGNOSIS — O1494 Unspecified pre-eclampsia, complicating childbirth: Principal | ICD-10-CM | POA: Diagnosis present

## 2016-02-03 DIAGNOSIS — R12 Heartburn: Secondary | ICD-10-CM | POA: Diagnosis present

## 2016-02-03 DIAGNOSIS — Z800 Family history of malignant neoplasm of digestive organs: Secondary | ICD-10-CM

## 2016-02-03 DIAGNOSIS — O3413 Maternal care for benign tumor of corpus uteri, third trimester: Secondary | ICD-10-CM | POA: Diagnosis present

## 2016-02-03 DIAGNOSIS — Z801 Family history of malignant neoplasm of trachea, bronchus and lung: Secondary | ICD-10-CM

## 2016-02-03 DIAGNOSIS — O321XX0 Maternal care for breech presentation, not applicable or unspecified: Secondary | ICD-10-CM | POA: Diagnosis present

## 2016-02-03 DIAGNOSIS — F319 Bipolar disorder, unspecified: Secondary | ICD-10-CM | POA: Diagnosis present

## 2016-02-03 DIAGNOSIS — O1490 Unspecified pre-eclampsia, unspecified trimester: Secondary | ICD-10-CM | POA: Diagnosis present

## 2016-02-03 DIAGNOSIS — O99344 Other mental disorders complicating childbirth: Secondary | ICD-10-CM | POA: Diagnosis present

## 2016-02-03 DIAGNOSIS — O1495 Unspecified pre-eclampsia, complicating the puerperium: Secondary | ICD-10-CM

## 2016-02-03 DIAGNOSIS — Z3A37 37 weeks gestation of pregnancy: Secondary | ICD-10-CM

## 2016-02-03 DIAGNOSIS — D259 Leiomyoma of uterus, unspecified: Secondary | ICD-10-CM | POA: Diagnosis present

## 2016-02-03 DIAGNOSIS — Z825 Family history of asthma and other chronic lower respiratory diseases: Secondary | ICD-10-CM

## 2016-02-03 NOTE — MAU Note (Signed)
PT  SAYS SHE STARTED  VOMITING  X2  SINCE      2300.     NOONE  ELSE  SICK  IN HOUSE .    NO FEVER.    NO DIARRHEA.    .     VE IN OFFICE  -   DIDN'T TELL HER   .   IS  [facility]   C/S    FOR  BREECH   AND  LOW - LYING  PLACENTA.    FOR  8-17.      PT  ON  BEDREST.

## 2016-02-04 ENCOUNTER — Inpatient Hospital Stay (HOSPITAL_COMMUNITY): Payer: BLUE CROSS/BLUE SHIELD | Admitting: Anesthesiology

## 2016-02-04 ENCOUNTER — Encounter (HOSPITAL_COMMUNITY)
Admission: AD | Disposition: A | Discharge status: Home / Self Care | Payer: Self-pay | Source: Ambulatory Visit | Attending: Obstetrics and Gynecology

## 2016-02-04 ENCOUNTER — Encounter (HOSPITAL_COMMUNITY): Payer: Self-pay | Admitting: Anesthesiology

## 2016-02-04 DIAGNOSIS — O99344 Other mental disorders complicating childbirth: Secondary | ICD-10-CM | POA: Diagnosis present

## 2016-02-04 DIAGNOSIS — O3413 Maternal care for benign tumor of corpus uteri, third trimester: Secondary | ICD-10-CM | POA: Diagnosis present

## 2016-02-04 DIAGNOSIS — Z833 Family history of diabetes mellitus: Secondary | ICD-10-CM | POA: Diagnosis not present

## 2016-02-04 DIAGNOSIS — O321XX1 Maternal care for breech presentation, fetus 1: Secondary | ICD-10-CM | POA: Diagnosis not present

## 2016-02-04 DIAGNOSIS — D259 Leiomyoma of uterus, unspecified: Secondary | ICD-10-CM | POA: Diagnosis present

## 2016-02-04 DIAGNOSIS — Z825 Family history of asthma and other chronic lower respiratory diseases: Secondary | ICD-10-CM | POA: Diagnosis not present

## 2016-02-04 DIAGNOSIS — Z800 Family history of malignant neoplasm of digestive organs: Secondary | ICD-10-CM | POA: Diagnosis not present

## 2016-02-04 DIAGNOSIS — R12 Heartburn: Secondary | ICD-10-CM | POA: Diagnosis present

## 2016-02-04 DIAGNOSIS — Z3A37 37 weeks gestation of pregnancy: Secondary | ICD-10-CM | POA: Diagnosis not present

## 2016-02-04 DIAGNOSIS — O1495 Unspecified pre-eclampsia, complicating the puerperium: Secondary | ICD-10-CM | POA: Diagnosis present

## 2016-02-04 DIAGNOSIS — Z801 Family history of malignant neoplasm of trachea, bronchus and lung: Secondary | ICD-10-CM | POA: Diagnosis not present

## 2016-02-04 DIAGNOSIS — O321XX0 Maternal care for breech presentation, not applicable or unspecified: Secondary | ICD-10-CM | POA: Diagnosis not present

## 2016-02-04 DIAGNOSIS — O1490 Unspecified pre-eclampsia, unspecified trimester: Secondary | ICD-10-CM | POA: Diagnosis present

## 2016-02-04 DIAGNOSIS — F319 Bipolar disorder, unspecified: Secondary | ICD-10-CM | POA: Diagnosis present

## 2016-02-04 DIAGNOSIS — O1494 Unspecified pre-eclampsia, complicating childbirth: Secondary | ICD-10-CM | POA: Diagnosis not present

## 2016-02-04 DIAGNOSIS — O1493 Unspecified pre-eclampsia, third trimester: Secondary | ICD-10-CM | POA: Diagnosis not present

## 2016-02-04 LAB — COMPREHENSIVE METABOLIC PANEL
ALT: 20 U/L (ref 14–54)
AST: 25 U/L (ref 15–41)
Albumin: 3 g/dL — ABNORMAL LOW (ref 3.5–5.0)
Alkaline Phosphatase: 135 U/L — ABNORMAL HIGH (ref 38–126)
Anion gap: 11 (ref 5–15)
BUN: 8 mg/dL (ref 6–20)
CO2: 19 mmol/L — ABNORMAL LOW (ref 22–32)
Calcium: 8.8 mg/dL — ABNORMAL LOW (ref 8.9–10.3)
Chloride: 106 mmol/L (ref 101–111)
Creatinine, Ser: 0.72 mg/dL (ref 0.44–1.00)
GFR calc Af Amer: >60 mL/min (ref >60)
GFR calc non Af Amer: >60 mL/min (ref >60)
Glucose, Bld: 86 mg/dL (ref 65–99)
Potassium: 3.8 mmol/L (ref 3.5–5.1)
Sodium: 136 mmol/L (ref 135–145)
Total Bilirubin: 0.4 mg/dL (ref 0.3–1.2)
Total Protein: 6.6 g/dL (ref 6.5–8.1)

## 2016-02-04 LAB — ABO/RH: ABO/RH(D): B POS

## 2016-02-04 LAB — RPR: RPR Ser Ql: NONREACTIVE

## 2016-02-04 LAB — TYPE AND SCREEN
ABO/RH(D): B POS
Antibody Screen: NEGATIVE

## 2016-02-04 LAB — MRSA PCR SCREENING: MRSA by PCR: NEGATIVE

## 2016-02-04 LAB — LACTATE DEHYDROGENASE: LDH: 185 U/L (ref 98–192)

## 2016-02-04 LAB — PROTEIN / CREATININE RATIO, URINE
Creatinine, Urine: 121 mg/dL
Protein Creatinine Ratio: 1.6 mg/mg{Cre} — ABNORMAL HIGH (ref 0.00–0.15)
Total Protein, Urine: 194 mg/dL

## 2016-02-04 LAB — CBC
HCT: 33.7 % — ABNORMAL LOW (ref 36.0–46.0)
Hemoglobin: 11.2 g/dL — ABNORMAL LOW (ref 12.0–15.0)
MCH: 28.8 pg (ref 26.0–34.0)
MCHC: 33.2 g/dL (ref 30.0–36.0)
MCV: 86.6 fL (ref 78.0–100.0)
Platelets: 196 10*3/uL (ref 150–400)
RBC: 3.89 MIL/uL (ref 3.87–5.11)
RDW: 13.4 % (ref 11.5–15.5)
WBC: 14.2 10*3/uL — ABNORMAL HIGH (ref 4.0–10.5)

## 2016-02-04 LAB — URIC ACID: Uric Acid, Serum: 5.4 mg/dL (ref 2.3–6.6)

## 2016-02-04 SURGERY — Surgical Case
Anesthesia: Spinal | Site: Abdomen

## 2016-02-04 MED ORDER — FENTANYL CITRATE (PF) 100 MCG/2ML IJ SOLN
INTRAMUSCULAR | Status: AC
  Filled 2016-02-04: qty 2

## 2016-02-04 MED ORDER — ONDANSETRON HCL 4 MG/2ML IJ SOLN
INTRAMUSCULAR | Status: AC
  Filled 2016-02-04: qty 2

## 2016-02-04 MED ORDER — LACTATED RINGERS IV SOLN
INTRAVENOUS | Status: DC
  Administered 2016-02-04 (×2): via INTRAVENOUS

## 2016-02-04 MED ORDER — GENTAMICIN SULFATE 40 MG/ML IJ SOLN
INTRAVENOUS | Status: DC
  Filled 2016-02-04: qty 8.5

## 2016-02-04 MED ORDER — MORPHINE SULFATE (PF) 0.5 MG/ML IJ SOLN
INTRAMUSCULAR | Status: DC | PRN
  Administered 2016-02-04: .2 mg via INTRATHECAL

## 2016-02-04 MED ORDER — ONDANSETRON HCL 4 MG/2ML IJ SOLN
INTRAMUSCULAR | Status: DC | PRN
  Administered 2016-02-04: 4 mg via INTRAVENOUS

## 2016-02-04 MED ORDER — OXYCODONE HCL 5 MG PO TABS
10.0000 mg | ORAL_TABLET | ORAL | Status: DC | PRN
  Administered 2016-02-07: 10 mg via ORAL
  Filled 2016-02-04 (×2): qty 2

## 2016-02-04 MED ORDER — DIPHENHYDRAMINE HCL 25 MG PO CAPS: 25.0000 mg | ORAL_CAPSULE | Freq: Four times a day (QID) | ORAL | Status: DC | PRN

## 2016-02-04 MED ORDER — NALOXONE HCL 0.4 MG/ML IJ SOLN: 0.4000 mg | INTRAMUSCULAR | Status: DC | PRN

## 2016-02-04 MED ORDER — SODIUM CHLORIDE 0.9 % IV SOLN
250.0000 mL | INTRAVENOUS | Status: DC
  Administered 2016-02-04: 250 mL via INTRAVENOUS

## 2016-02-04 MED ORDER — ACETAMINOPHEN 500 MG PO TABS
1000.0000 mg | ORAL_TABLET | Freq: Four times a day (QID) | ORAL | Status: AC
  Administered 2016-02-04 – 2016-02-05 (×4): 1000 mg via ORAL
  Filled 2016-02-04 (×4): qty 2

## 2016-02-04 MED ORDER — PRENATAL MULTIVITAMIN CH
1.0000 | ORAL_TABLET | Freq: Every day | ORAL | Status: DC
  Administered 2016-02-04 – 2016-02-07 (×4): 1 via ORAL
  Filled 2016-02-04 (×4): qty 1

## 2016-02-04 MED ORDER — KETOROLAC TROMETHAMINE 30 MG/ML IJ SOLN
INTRAMUSCULAR | Status: AC
  Filled 2016-02-04: qty 1

## 2016-02-04 MED ORDER — OXYCODONE HCL 5 MG PO TABS
5.0000 mg | ORAL_TABLET | ORAL | Status: DC | PRN
  Administered 2016-02-05 – 2016-02-06 (×7): 5 mg via ORAL
  Filled 2016-02-04 (×7): qty 1

## 2016-02-04 MED ORDER — BUPIVACAINE IN DEXTROSE 0.75-8.25 % IT SOLN
INTRATHECAL | Status: DC | PRN
  Administered 2016-02-04: 1.6 mL via INTRATHECAL

## 2016-02-04 MED ORDER — MEPERIDINE HCL 25 MG/ML IJ SOLN: 6.2500 mg | INTRAMUSCULAR | Status: DC | PRN

## 2016-02-04 MED ORDER — MAGNESIUM SULFATE 50 % IJ SOLN
2.0000 g/h | INTRAVENOUS | Status: DC
  Administered 2016-02-05: 2 g/h via INTRAVENOUS
  Filled 2016-02-04 (×2): qty 80

## 2016-02-04 MED ORDER — ONDANSETRON HCL 4 MG/2ML IJ SOLN: 4.0000 mg | Freq: Once | INTRAMUSCULAR | Status: DC | PRN

## 2016-02-04 MED ORDER — WITCH HAZEL-GLYCERIN EX PADS: 1.0000 "application " | MEDICATED_PAD | CUTANEOUS | Status: DC | PRN

## 2016-02-04 MED ORDER — PHENYLEPHRINE 8 MG IN D5W 100 ML (0.08MG/ML) PREMIX OPTIME
INJECTION | INTRAVENOUS | Status: DC | PRN
  Administered 2016-02-04: 60 ug/min via INTRAVENOUS

## 2016-02-04 MED ORDER — SODIUM CHLORIDE 0.9% FLUSH: 3.0000 mL | INTRAVENOUS | Status: DC | PRN

## 2016-02-04 MED ORDER — MORPHINE SULFATE-NACL 0.5-0.9 MG/ML-% IV SOSY
PREFILLED_SYRINGE | INTRAVENOUS | Status: AC
  Filled 2016-02-04: qty 1

## 2016-02-04 MED ORDER — NALBUPHINE HCL 10 MG/ML IJ SOLN: 5.0000 mg | Freq: Once | INTRAMUSCULAR | Status: DC | PRN

## 2016-02-04 MED ORDER — NALBUPHINE HCL 10 MG/ML IJ SOLN: 5.0000 mg | INTRAMUSCULAR | Status: DC | PRN

## 2016-02-04 MED ORDER — SIMETHICONE 80 MG PO CHEW
80.0000 mg | CHEWABLE_TABLET | Freq: Three times a day (TID) | ORAL | Status: DC
  Administered 2016-02-04 – 2016-02-07 (×12): 80 mg via ORAL
  Filled 2016-02-04 (×10): qty 1

## 2016-02-04 MED ORDER — IBUPROFEN 600 MG PO TABS
600.0000 mg | ORAL_TABLET | Freq: Four times a day (QID) | ORAL | Status: DC
  Administered 2016-02-04 – 2016-02-07 (×12): 600 mg via ORAL
  Filled 2016-02-04 (×12): qty 1

## 2016-02-04 MED ORDER — IBUPROFEN 600 MG PO TABS: 600.0000 mg | ORAL_TABLET | Freq: Four times a day (QID) | ORAL | Status: DC | PRN

## 2016-02-04 MED ORDER — DEXTROSE 5 % IV SOLN
1.0000 ug/kg/h | INTRAVENOUS | Status: DC | PRN
  Filled 2016-02-04: qty 2

## 2016-02-04 MED ORDER — PHENYLEPHRINE 8 MG IN D5W 100 ML (0.08MG/ML) PREMIX OPTIME
INJECTION | INTRAVENOUS | Status: AC
Start: 2016-02-04 — End: 2016-02-04
  Filled 2016-02-04: qty 100

## 2016-02-04 MED ORDER — BISACODYL 10 MG RE SUPP
10.0000 mg | Freq: Every day | RECTAL | Status: DC | PRN
  Administered 2016-02-06: 10 mg via RECTAL
  Filled 2016-02-04: qty 1

## 2016-02-04 MED ORDER — DIPHENHYDRAMINE HCL 50 MG/ML IJ SOLN: 12.5000 mg | INTRAMUSCULAR | Status: DC | PRN

## 2016-02-04 MED ORDER — DIBUCAINE 1 % RE OINT: 1.0000 "application " | TOPICAL_OINTMENT | RECTAL | Status: DC | PRN

## 2016-02-04 MED ORDER — BUPIVACAINE HCL (PF) 0.25 % IJ SOLN
INTRAMUSCULAR | Status: AC
  Filled 2016-02-04: qty 10

## 2016-02-04 MED ORDER — COCONUT OIL OIL
1.0000 "application " | TOPICAL_OIL | Status: DC | PRN
  Filled 2016-02-04: qty 120

## 2016-02-04 MED ORDER — DEXAMETHASONE SODIUM PHOSPHATE 4 MG/ML IJ SOLN
INTRAMUSCULAR | Status: AC
  Filled 2016-02-04: qty 1

## 2016-02-04 MED ORDER — BUPIVACAINE HCL (PF) 0.25 % IJ SOLN
INTRAMUSCULAR | Status: DC | PRN
  Administered 2016-02-04: 9 mL

## 2016-02-04 MED ORDER — OXYTOCIN 40 UNITS IN LACTATED RINGERS INFUSION - SIMPLE MED: 2.5000 [IU]/h | INTRAVENOUS | Status: DC

## 2016-02-04 MED ORDER — TETANUS-DIPHTH-ACELL PERTUSSIS 5-2.5-18.5 LF-MCG/0.5 IM SUSP
0.5000 mL | Freq: Once | INTRAMUSCULAR | Status: DC
  Filled 2016-02-04: qty 0.5

## 2016-02-04 MED ORDER — SOD CITRATE-CITRIC ACID 500-334 MG/5ML PO SOLN
30.0000 mL | Freq: Once | ORAL | Status: AC
  Administered 2016-02-04: 30 mL via ORAL
  Filled 2016-02-04: qty 15

## 2016-02-04 MED ORDER — MEASLES, MUMPS & RUBELLA VAC ~~LOC~~ INJ: 0.5000 mL | INJECTION | Freq: Once | SUBCUTANEOUS | Status: DC

## 2016-02-04 MED ORDER — LIDOCAINE HCL (PF) 1 % IJ SOLN
INTRAMUSCULAR | Status: AC
  Filled 2016-02-04: qty 5

## 2016-02-04 MED ORDER — ZOLPIDEM TARTRATE 5 MG PO TABS: 5.0000 mg | ORAL_TABLET | Freq: Every evening | ORAL | Status: DC | PRN

## 2016-02-04 MED ORDER — DIPHENHYDRAMINE HCL 25 MG PO CAPS: 25.0000 mg | ORAL_CAPSULE | ORAL | Status: DC | PRN

## 2016-02-04 MED ORDER — FERROUS SULFATE 325 (65 FE) MG PO TABS
325.0000 mg | ORAL_TABLET | Freq: Two times a day (BID) | ORAL | Status: DC
  Administered 2016-02-04 – 2016-02-07 (×6): 325 mg via ORAL
  Filled 2016-02-04 (×6): qty 1

## 2016-02-04 MED ORDER — SIMETHICONE 80 MG PO CHEW: 80.0000 mg | CHEWABLE_TABLET | ORAL | Status: DC | PRN

## 2016-02-04 MED ORDER — SCOPOLAMINE 1 MG/3DAYS TD PT72
1.0000 | MEDICATED_PATCH | Freq: Once | TRANSDERMAL | Status: DC
  Filled 2016-02-04: qty 1

## 2016-02-04 MED ORDER — DEXAMETHASONE SODIUM PHOSPHATE 4 MG/ML IJ SOLN
INTRAMUSCULAR | Status: DC | PRN
  Administered 2016-02-04: 4 mg via INTRAVENOUS

## 2016-02-04 MED ORDER — KETOROLAC TROMETHAMINE 30 MG/ML IJ SOLN: 30.0000 mg | Freq: Four times a day (QID) | INTRAMUSCULAR | Status: DC | PRN

## 2016-02-04 MED ORDER — DEXTROSE 5 % IV SOLN
INTRAVENOUS | Status: DC | PRN
  Administered 2016-02-04: 229 mL via INTRAVENOUS

## 2016-02-04 MED ORDER — SCOPOLAMINE 1 MG/3DAYS TD PT72
MEDICATED_PATCH | TRANSDERMAL | Status: AC
  Filled 2016-02-04: qty 1

## 2016-02-04 MED ORDER — SENNOSIDES-DOCUSATE SODIUM 8.6-50 MG PO TABS
2.0000 | ORAL_TABLET | ORAL | Status: DC
  Administered 2016-02-04 – 2016-02-07 (×3): 2 via ORAL
  Filled 2016-02-04 (×3): qty 2

## 2016-02-04 MED ORDER — SIMETHICONE 80 MG PO CHEW
80.0000 mg | CHEWABLE_TABLET | ORAL | Status: DC
  Administered 2016-02-04: 80 mg via ORAL
  Filled 2016-02-04 (×3): qty 1

## 2016-02-04 MED ORDER — SODIUM CHLORIDE 0.9% FLUSH
3.0000 mL | Freq: Two times a day (BID) | INTRAVENOUS | Status: DC
  Administered 2016-02-05 – 2016-02-06 (×2): 3 mL via INTRAVENOUS

## 2016-02-04 MED ORDER — HYDROMORPHONE HCL 1 MG/ML IJ SOLN: 0.2500 mg | INTRAMUSCULAR | Status: DC | PRN

## 2016-02-04 MED ORDER — LACTATED RINGERS IV SOLN
INTRAVENOUS | Status: DC
  Administered 2016-02-04 – 2016-02-05 (×3): via INTRAVENOUS

## 2016-02-04 MED ORDER — OXYTOCIN 10 UNIT/ML IJ SOLN
INTRAMUSCULAR | Status: AC
  Filled 2016-02-04: qty 4

## 2016-02-04 MED ORDER — MEDROXYPROGESTERONE ACETATE 150 MG/ML IM SUSP: 150.0000 mg | INTRAMUSCULAR | Status: DC | PRN

## 2016-02-04 MED ORDER — KETOROLAC TROMETHAMINE 30 MG/ML IJ SOLN
30.0000 mg | Freq: Four times a day (QID) | INTRAMUSCULAR | Status: DC | PRN
  Administered 2016-02-04: 30 mg via INTRAMUSCULAR

## 2016-02-04 MED ORDER — MAGNESIUM SULFATE BOLUS VIA INFUSION
6.0000 g | Freq: Once | INTRAVENOUS | Status: AC
  Administered 2016-02-04: 6 g via INTRAVENOUS
  Filled 2016-02-04: qty 500

## 2016-02-04 MED ORDER — FENTANYL CITRATE (PF) 100 MCG/2ML IJ SOLN
INTRAMUSCULAR | Status: DC | PRN
  Administered 2016-02-04: 20 ug via INTRATHECAL

## 2016-02-04 MED ORDER — KETOROLAC TROMETHAMINE 30 MG/ML IJ SOLN: 30.0000 mg | Freq: Once | INTRAMUSCULAR | Status: DC

## 2016-02-04 MED ORDER — ONDANSETRON HCL 4 MG/2ML IJ SOLN: 4.0000 mg | Freq: Three times a day (TID) | INTRAMUSCULAR | Status: DC | PRN

## 2016-02-04 MED ORDER — MENTHOL 3 MG MT LOZG
1.0000 | LOZENGE | OROMUCOSAL | Status: DC | PRN
Start: 2016-02-04 — End: 2016-02-07

## 2016-02-04 MED ORDER — FLEET ENEMA 7-19 GM/118ML RE ENEM: 1.0000 | ENEMA | Freq: Every day | RECTAL | Status: DC | PRN

## 2016-02-04 SURGICAL SUPPLY — 49 items
APL SKNCLS STERI-STRIP NONHPOA (GAUZE/BANDAGES/DRESSINGS) ×1
BARRIER ADHS 3X4 INTERCEED (GAUZE/BANDAGES/DRESSINGS) ×2 IMPLANT
BENZOIN TINCTURE PRP APPL 2/3 (GAUZE/BANDAGES/DRESSINGS) ×1 IMPLANT
BRR ADH 4X3 ABS CNTRL BYND (GAUZE/BANDAGES/DRESSINGS) ×1
CHLORAPREP W/TINT 26ML (MISCELLANEOUS) ×2 IMPLANT
CLAMP CORD UMBIL (MISCELLANEOUS) IMPLANT
CLOSURE STERI STRIP 1/2 X4 (GAUZE/BANDAGES/DRESSINGS) ×1 IMPLANT
CLOTH BEACON ORANGE TIMEOUT ST (SAFETY) ×2 IMPLANT
CONTAINER PREFILL 10% NBF 15ML (MISCELLANEOUS) IMPLANT
DRAPE C SECTION CLR SCREEN (DRAPES) ×2 IMPLANT
DRSG OPSITE POSTOP 4X10 (GAUZE/BANDAGES/DRESSINGS) ×2 IMPLANT
ELECT REM PT RETURN 9FT ADLT (ELECTROSURGICAL) ×2
ELECTRODE REM PT RTRN 9FT ADLT (ELECTROSURGICAL) ×1 IMPLANT
EXTRACTOR VACUUM M CUP 4 TUBE (SUCTIONS) IMPLANT
GLOVE BIO SURGEON STRL SZ7 (GLOVE) ×1 IMPLANT
GLOVE BIOGEL PI IND STRL 7.0 (GLOVE) ×2 IMPLANT
GLOVE BIOGEL PI IND STRL 7.5 (GLOVE) IMPLANT
GLOVE BIOGEL PI INDICATOR 7.0 (GLOVE) ×5
GLOVE BIOGEL PI INDICATOR 7.5 (GLOVE) ×1
GLOVE ECLIPSE 6.5 STRL STRAW (GLOVE) ×2 IMPLANT
GOWN STRL REUS W/TWL LRG LVL3 (GOWN DISPOSABLE) ×5 IMPLANT
KIT ABG SYR 3ML LUER SLIP (SYRINGE) IMPLANT
NDL HYPO 25X5/8 SAFETYGLIDE (NEEDLE) IMPLANT
NEEDLE HYPO 22GX1.5 SAFETY (NEEDLE) ×2 IMPLANT
NEEDLE HYPO 25X5/8 SAFETYGLIDE (NEEDLE) IMPLANT
NS IRRIG 1000ML POUR BTL (IV SOLUTION) ×2 IMPLANT
PACK C SECTION WH (CUSTOM PROCEDURE TRAY) ×2 IMPLANT
PAD OB MATERNITY 4.3X12.25 (PERSONAL CARE ITEMS) ×2 IMPLANT
RTRCTR C-SECT PINK 25CM LRG (MISCELLANEOUS) IMPLANT
STRIP CLOSURE SKIN 1/2X4 (GAUZE/BANDAGES/DRESSINGS) IMPLANT
SUT CHROMIC GUT AB #0 18 (SUTURE) IMPLANT
SUT MNCRL 0 VIOLET CTX 36 (SUTURE) ×3 IMPLANT
SUT MON AB 2-0 SH 27 (SUTURE)
SUT MON AB 2-0 SH27 (SUTURE) IMPLANT
SUT MON AB 3-0 SH 27 (SUTURE)
SUT MON AB 3-0 SH27 (SUTURE) IMPLANT
SUT MON AB 4-0 PS1 27 (SUTURE) IMPLANT
SUT MONOCRYL 0 CTX 36 (SUTURE) ×3
SUT PLAIN 2 0 (SUTURE)
SUT PLAIN 2 0 XLH (SUTURE) IMPLANT
SUT PLAIN ABS 2-0 CT1 27XMFL (SUTURE) IMPLANT
SUT VIC AB 0 CT1 36 (SUTURE) ×4 IMPLANT
SUT VIC AB 2-0 CT1 27 (SUTURE) ×2
SUT VIC AB 2-0 CT1 TAPERPNT 27 (SUTURE) ×1 IMPLANT
SUT VIC AB 4-0 PS2 18 (SUTURE) ×1 IMPLANT
SUT VIC AB 4-0 PS2 27 (SUTURE) ×1 IMPLANT
SYR CONTROL 10ML LL (SYRINGE) ×2 IMPLANT
TOWEL OR 17X24 6PK STRL BLUE (TOWEL DISPOSABLE) ×2 IMPLANT
TRAY FOLEY CATH SILVER 14FR (SET/KITS/TRAYS/PACK) ×1 IMPLANT

## 2016-02-04 NOTE — Progress Notes (Signed)
Patient transferred to [facility] Unit. Pt ambulated to Room # 118. All belongings were taken with her. Baby and patients' spouse accompanied her.

## 2016-02-04 NOTE — Progress Notes (Signed)
Pt ambulated well in hallway with standby assist. Tolerated well and then back to bed. Linda [name]

## 2016-02-04 NOTE — MAU Provider Note (Signed)
History     Chief Complaint  Patient presents with  . Emesis   40 yo G2P0 MWF @ 37 2/[redacted] week gestation presents with complaint of vomiting x 2 tonight (+) nausea. Denies any family member ill. No h/a. Notes weight loss of 6lb since bedrest and decreased leg swelling. Some ruq discomfort. Heartburn unchanged but ate pizza @ 9 pm . Blurred vision for which was seen on 8/1 has improved. Active fetus. No LOF or vaginal bleeding. Known breech scheduled for primary C/S 8/17    OB History    Gravida Para Term Preterm AB Living   2 1 1   1 1    SAB TAB Ectopic Multiple Live Births   1     0 1      Past Medical History:  Diagnosis Date  . Bipolar disorder ([place]) 2008  . History of abnormal Pap smear 1/01   mild dysplasia, HPV  . Infertility     Past Surgical History:  Procedure Laterality Date  . AUGMENTATION MAMMAPLASTY Bilateral 6/07   implants  . IVF Transfer 06/07/2015 N/A 06/07/2015  . THROAT SURGERY  1998   throat absess  . TONSILLECTOMY  1998    Family History  Problem Relation Age of Onset  . Hyperlipidemia Mother   . COPD Mother   . Esophageal cancer Maternal Aunt 52    esophageal, smoker  . Diabetes Maternal Grandfather   . Cancer Paternal Grandmother     melanoma  . Dementia Paternal Grandmother   . Lung cancer Paternal Grandfather     lung    Social History  Substance Use Topics  . Smoking status: Never Smoker  . Smokeless tobacco: Never Used  . Alcohol use Yes     Comment: social    Allergies:  Allergies  Allergen Reactions  . Penicillins Hives    Has patient had a PCN reaction causing immediate rash, facial/tongue/throat swelling, SOB or lightheadedness with hypotension: Yes Has patient had a PCN reaction causing severe rash involving mucus membranes or skin necrosis: No Has patient had a PCN reaction that required hospitalization No Has patient had a PCN reaction occurring within the last 10 years: No If all of the above answers are "NO", then may  proceed with Cephalosporin use.     Prescriptions Prior to Admission  Medication Sig Dispense Refill Last Dose  . acetaminophen (TYLENOL) 325 MG tablet Take 650 mg by mouth every 6 (six) hours as needed for mild pain.   Past Week at Unknown time  . calcium carbonate (TUMS - DOSED IN MG ELEMENTAL CALCIUM) 500 MG chewable tablet Chew 2 tablets by mouth 3 (three) times daily as needed for indigestion or heartburn.   01/31/2016 at Unknown time  . esomeprazole (NEXIUM) 40 MG capsule Take 40 mg by mouth daily at 12 noon.   01/31/2016 at Unknown time  . Prenatal Vit-Fe Fumarate-FA (PRENATAL MULTIVITAMIN) TABS tablet Take 1 tablet by mouth daily at 12 noon.   02/01/2016 at Unknown time  . zolpidem (AMBIEN) 5 MG tablet Take 5 mg by mouth at bedtime as needed for sleep.   01/31/2016 at Unknown time     Physical Exam   Blood pressure 143/81, pulse 81, temperature 98.4 F (36.9 C), temperature source Oral, resp. rate 20, height 5\' 5"  (1.651 m), weight 83.2 kg (183 lb 8 oz), last menstrual period 04/17/2015.  General appearance: alert, cooperative and no distress Lungs: clear to auscultation bilaterally Heart: regular rate and rhythm Abdomen: gravid nontender Pelvic:  external genitalia normal and closed/oop/firm Extremities: edema 2+  DTR 2+  Tracing: baseline 160 (+) accel to 180 some short variables  irreg ctx    A/P : nausea/vomiting in pregnancy ? GI virus however In light of cont elev BP  Will r/o preeclampsia again Breech IUP@ 37 2/[redacted] wks gestation  P) Cbc, CMP, protein/creatinine ratio ED Course   MDM   Linda [name] A, MD 1:11 AM 02/04/2016   Addendum:  CBC: plt 196K hgb 11.2 AST/ALT 25/20 Protein/creatinine ratio: 1.6( markedly increased since undetectable 8/1)  given the above: new dx of preeclampsia. Will proceed with delivery. Prim C/S for breech. To follow another C/s in progress.

## 2016-02-04 NOTE — Addendum Note (Signed)
Addendum  created 02/04/16 1628 by Hewitt Blade, CRNA   Sign clinical note

## 2016-02-04 NOTE — Progress Notes (Signed)
UR chart review completed.  

## 2016-02-04 NOTE — Progress Notes (Signed)
When I entered the room, pt was attempted to breastfeed, I offered assistance and have provided pump. I told them that they could let me know when baby was finished trying to latch and I could help mom pump, per lactation's recommendation. Linda [name]

## 2016-02-04 NOTE — Op Note (Signed)
NAMEBRYAHNA, ROATH                  ACCOUNT NO.:  [id number]  MEDICAL RECORD NO.:  [id number]  LOCATION:  [id number]                          FACILITY:  [facility]  PHYSICIAN:  Servando Salina, M.D.DATE OF BIRTH:  1976/03/21  DATE OF PROCEDURE:  02/04/2016 DATE OF DISCHARGE:                              OPERATIVE REPORT   PREOPERATIVE DIAGNOSES:  Pre-eclampsia, breech presentation, intrauterine gestation at 37-2/7th weeks.  PROCEDURES:  Primary cesarean section, Kerr hysterotomy.  POSTOPERATIVE DIAGNOSES:  Pre-eclampsia,complete breech presentation, intrauterine gestation at 37-2/7th weeks, left fibroid.  ANESTHESIA:  Spinal.  SURGEON:  Servando Salina, M.D.  ASSISTANT:  Haskel Khan, CNM  DESCRIPTION OF PROCEDURE:  Under adequate spinal anesthesia, the patient was placed in supine position with a left lateral tilt.  She was sterilely prepped and draped in usual fashion.  An indwelling Foley catheter was sterilely placed.  Marcaine 0.25% was injected along the planned Pfannenstiel skin incision site.  Pfannenstiel skin incision was then made, carried down to the rectus fascia.  The rectus fascia was opened transversely.  Rectus fascia was then bluntly and sharply dissected off the rectus muscle in a superior and inferior fashion.  The rectus muscle was split in the midline.  The parietal peritoneum was entered sharply and extended.  A self-retaining Alexis retractor was then placed.  Vesicouterine peritoneum was opened transversely.  The bladder was then bluntly dissected off the lower uterine segment and displaced inferiorly.  A curvilinear low transverse uterine incision was then made and extended with bandage scissors.  Clear amniotic sac was noted.  Subsequent delivery with artificial rupture of membranes of a live female from a complete breech presentation using the usual breech maneuver was accomplished.  The baby was bulb suctioned at the abdomen. The cord was clamped, cut.   The baby was transferred to the awaiting pediatricians.  Apgars were 8 and 10 at 1 and 5 minutes.  The placenta which was posterior, was spontaneous, intact, not sent to Pathology. Uterine cavity was cleaned of debris.  No intrauterine anomaly noted. Uterine incision had no extension.  It was closed in 2 layers, the first layer with 0 Monocryl running lock stitch, second was imbricating using 0 Monocryl suture.  Good hemostasis was noted.  The abdomen was irrigated and suctioned of debris.  Normal tubes and ovaries were noted bilaterally.  Of note, the left uteroovarian ligament had about a 1 cm firm, mass consistent with a fibroid in the location that was not amenable to removal at this time.  The Alexis retractor was removed. Interceed was placed over lower uterine segment in an inverted T- fashion.  The parietal peritoneum was closed with 2-0 Vicryl.  The rectus fascia was closed with 0 Vicryl x2.  The subcutaneous area was irrigated, small bleeders cauterized.  Interrupted 2-0 plain sutures placed and the skin was approximated with 4-0 Vicryl subcuticular closure.  Benzoin and Steri-Strips were placed.  SPECIMENS:  Placenta not sent to Pathology.  ESTIMATED BLOOD LOSS:  600 mL.  INTRAOPERATIVE FLUIDS:  700 ml.  URINE OUTPUT:  74ml.  COUNTS:  Sponge and instrument counts x2 was correct.  COMPLICATIONS:  None.  The patient tolerated the procedure well,  was transferred to recovery room in stable condition.     Servando Salina, M.D.     [place]/MEDQ  D:  02/04/2016  T:  02/04/2016  Job:  [id number]

## 2016-02-04 NOTE — Plan of Care (Signed)
Problem: Nutritional: Goal: Dietary intake will improve Outcome: Progressing Pt tolerated a regular diet for lunch. Goal: Mother's verbalization of comfort with breastfeeding process will improve Outcome: Progressing Baby is breastfeeding, mother states she is physically comfortable at this time. Needs to work on positions, but doesn't want to change at this time because baby is latched. Marry Guan    Problem: Role Relationship: Goal: Ability to demonstrate positive interaction with newborn will improve Outcome: Completed/Met Date Met: 02/04/16 Mother, father and other family members are incorporating baby into family system well.  Problem: Pain Management: Goal: General experience of comfort will improve and pain level will decrease Outcome: Progressing Pt is comfortable at this time.   Problem: Respiratory: Goal: Ability to maintain adequate ventilation will improve Outcome: Completed/Met Date Met: 02/04/16 No issues. RR and O2 sat are WNL.   Problem: Skin Integrity: Goal: Demonstration of wound healing without infection will improve Outcome: Progressing No issues at this time.

## 2016-02-04 NOTE — Lactation Note (Signed)
This note was copied from a baby's chart. Lactation Consultation Note  Patient Name: Linda [name] [id number] Date: 02/04/2016 Reason for consult: Initial assessment;Late preterm infant   Initial consult with first time mom of 68 hour old infant. Infant born at 37w 2d GA by C/S. Mom with history of infertility with IVF and breast augmentation. Mom reports her breast implants were placed behind the muscle. Incisions are under the breast and there was no surgical involvement of the nipple. Mom currently in AICU on MgSO4.   Mom with soft compressible breast and colostrum was easy to express. Mom's right nipple is everted, her left nipple flattens with compression. Gave mom hand pump to use prior to latching and breast shells with instructions for use and cleaning.   Infant was trying to latch to the left breast in the football hold when I went into the room. Infant was unable to sustain the latch as nipple would flatten with compression. Infant was asleep and was removed for evening assessment. She is noted to have a small mouth and noted to pull tongue back in mouth behind gumline when sucking on gloved finger.  Taught mom how to hand express and she was able to hand express 3 cc colostrum. Parents were taught to spoon feed infant and dad spoon fed infant part of feeding. Mom did very well with hand expressing and was pleased to see colostrum.  After spoon feeding infant she was cueing to feed. Assisted mom in latching infant to right breast in the cross cradle hold. Infant was unable to sustain latch despite several tries. I then applied a # 20 NS to left breast and infant was noted to sustain latch for 5 minutes. Infant then fell asleep. No colostrum was noted in NS after she came off. Showed mom how to place NS. Mom is very tired and may need reiteration of teaching at a later time. Colostrum collection containers and spoon was in the room.   LPT infant policy given to parents to follow. Explained to  parents that Merrilyn Puma is borderline LPT/early term and may act like one or the other. Parents voiced understanding. Mom is extremely tired at this time, encouraged her to rest. Spoke with Abby, RN and asked her to set up pump tonight to begin pumping after BF to stimulate milk to come in due to LPT infant, Breast Augmentation and history of of fertility.   Plan: Discussed with parents and Abby, RN Breast feed infant 8-12 x in 24 hours, awaken at 3 hours if needed to encourage infant to feed Hand express and spoon feed infant any colostrum collected Begin pumping this evening/tonight post BF for 15 minutes on Initiate setting every 2-3 hours Follow pumping with hand expression Call for assistance as needed. Follow up tomorrow and prn     Maternal Data Formula Feeding for Exclusion: No Has patient been taught Hand Expression?: Yes Does the patient have breastfeeding experience prior to this delivery?: No  Feeding Feeding Type: Breast Fed Length of feed: 15 min  LATCH Score/Interventions Latch: Repeated attempts needed to sustain latch, nipple held in mouth throughout feeding, stimulation needed to elicit sucking reflex. Intervention(s): Adjust position;Assist with latch;Breast massage;Breast compression  Audible Swallowing: A few with stimulation Intervention(s): Hand expression;Skin to skin  Type of Nipple: Flat (Right nipple everted, left nipple flattens with stimulation) Intervention(s): Shells;Hand pump  Comfort (Breast/Nipple): Soft / non-tender     Hold (Positioning): Assistance needed to correctly position infant at breast and maintain latch. Intervention(s):  Breastfeeding basics reviewed;Support Pillows;Position options;Skin to skin  LATCH Score: 6  Lactation Tools Discussed/Used Tools: Shells;Nipple [place] Fuel;Pump Nipple shield size: 20 Breast pump type: Manual WIC Program: No   Consult Status Consult Status: Follow-up Date: 02/05/16 Follow-up type:  In-patient    Debby Freiberg Gracy Ehly 02/04/2016, 4:39 PM

## 2016-02-04 NOTE — H&P (Signed)
Linda Linda [name] is Linda [name] 40 y.o. female presenting @ 37 2/7 weeks for primary C/s due to preeclampsia and breech presentation. Pt c/o nausea and vomiting. (+) heartburn but has been on protonix. (+) leg swelling. Seen 8/1 for workup for toxemia and was negative. Now has protein/creatinine ratio of 1.6( from being immeasurable 8/1)  OB History    Gravida Para Term Preterm AB Living   2 0 0   1     SAB TAB Ectopic Multiple Live Births   1             Past Medical History:  Diagnosis Date  . Bipolar disorder ([place]) 2008  . History of abnormal Pap smear 1/01   mild dysplasia, HPV  . Infertility    Past Surgical History:  Procedure Laterality Date  . AUGMENTATION MAMMAPLASTY Bilateral 6/07   implants  . IVF Transfer 06/07/2015 Linda Linda [name] 06/07/2015  . THROAT SURGERY  1998   throat absess  . TONSILLECTOMY  1998   Family History: family history includes COPD in her mother; Cancer in her paternal grandmother; Dementia in her paternal grandmother; Diabetes in her maternal grandfather; Esophageal cancer (age of onset: 58) in her maternal aunt; Hyperlipidemia in her mother; Lung cancer in her paternal grandfather. Social History:  reports that she has never smoked. She has never used smokeless tobacco. She reports that she drinks alcohol. She reports that she does not use drugs.     Maternal Diabetes: No Genetic Screening: Normal Maternal Ultrasounds/Referrals: Normal Fetal Ultrasounds or other Referrals:  None Maternal Substance Abuse:  No Significant Maternal Medications:  Meds include: Protonix Significant Maternal Lab Results:  Lab values include: Group B Strep negative Other Comments:  preeclampsia, breech IVF donor egg  Review of Systems  Eyes: Positive for blurred vision.  Cardiovascular: Positive for leg swelling.  Gastrointestinal: Positive for heartburn and nausea.   Maternal Medical History:  Reason for admission: Nausea.   Contractions: Perceived severity is mild.    Fetal activity:  Perceived fetal activity is normal.    Prenatal complications: Pre-eclampsia.   Prenatal Complications - Diabetes: none.    Dilation: Closed Exam by:: Dr. Garwin [name] Blood pressure 143/81, pulse 81, temperature 98.4 F (36.9 C), temperature source Oral, resp. rate 20, height 5\' 5"  (1.651 m), weight 83.2 kg (183 lb 8 oz), last menstrual period 04/17/2015.  Patient Vitals for the past 24 hrs:  BP Temp Temp src Pulse Resp Height Weight  02/04/16 0031 143/81 - - 81 - - -  02/04/16 0023 143/82 - - 84 - - -  02/04/16 0011 141/92 - - 79 - - -  02/03/16 2353 138/92 98.4 F (36.9 C) Oral 83 20 5\' 5"  (1.651 m) 83.2 kg (183 lb 8 oz)    Exam Physical Exam  Constitutional: She is oriented to person, place, and time. She appears well-developed and well-nourished.  HENT:  Head: Atraumatic.  Eyes: EOM are normal.  Neck: Neck supple.  Cardiovascular: Regular rhythm.   Respiratory: Breath sounds normal.  GI: Soft.  Musculoskeletal: She exhibits edema.  Neurological: She is alert and oriented to person, place, and time.  Skin: Skin is warm and dry.  Psychiatric: She has Linda [name] normal mood and affect.   VE closed/2 cm long/oop  Prenatal labs: ABO, Rh:  B positive Antibody:  neg Rubella:  Immune RPR:   NR HBsAg:negative    HIV:   neg GBS:   neg CBC    Component Value Date/Time   WBC 14.2 (H) 02/04/2016 0012  RBC 3.89 02/04/2016 0012   HGB 11.2 (L) 02/04/2016 0012   HGB 14.1 11/20/2014 1336   HCT 33.7 (L) 02/04/2016 0012   PLT 196 02/04/2016 0012   MCV 86.6 02/04/2016 0012   MCH 28.8 02/04/2016 0012   MCHC 33.2 02/04/2016 0012   RDW 13.4 02/04/2016 0012   LYMPHSABS 2.3 11/20/2013 1645   MONOABS 0.9 11/20/2013 1645   EOSABS 0.1 11/20/2013 1645   BASOSABS 0.0 11/20/2013 1645   CMP     Component Value Date/Time   NA 136 02/04/2016 0012   K 3.8 02/04/2016 0012   CL 106 02/04/2016 0012   CO2 19 (L) 02/04/2016 0012   GLUCOSE 86 02/04/2016 0012   BUN 8 02/04/2016 0012   CREATININE  0.72 02/04/2016 0012   CALCIUM 8.8 (L) 02/04/2016 0012   PROT 6.6 02/04/2016 0012   ALBUMIN 3.0 (L) 02/04/2016 0012   AST 25 02/04/2016 0012   ALT 20 02/04/2016 0012   ALKPHOS 135 (H) 02/04/2016 0012   BILITOT 0.4 02/04/2016 0012   GFRNONAA >60 02/04/2016 0012   GFRAA >60 02/04/2016 0012  protein/creatinine ratio 1.6 Bed side sono: breech Tracing: cat. irreg ctx  Assessment/Plan: Preeclampsia Breech. Declined ECV IUP @ 37 2/7 week  P) admit Primary C/S @ 5 am due to last meal time. Will start magnesium postop. Risk of surgery reviewed  Including infection, bleeding, poss blood transfusion and its risk, injury to surrounding organ structures, internal scar tissue. All? answered    Linda Linda [name] 02/04/2016, 1:33 AM

## 2016-02-04 NOTE — Anesthesia Procedure Notes (Addendum)
Spinal  Patient location during procedure: OR Start time: 02/04/2016 6:21 AM End time: 02/04/2016 6:23 AM Staffing Anesthesiologist: Lyn Hollingshead Performed: anesthesiologist  Preanesthetic Checklist Completed: patient identified, surgical consent, pre-op evaluation, timeout performed, IV checked, risks and benefits discussed and monitors and equipment checked Spinal Block Patient position: sitting Prep: site prepped and draped and DuraPrep Patient monitoring: heart rate, cardiac monitor, continuous pulse ox and blood pressure Approach: midline Location: L3-4 Injection technique: single-shot Needle Needle type: Sprotte  Needle gauge: 24 G Needle length: 9 cm Needle insertion depth: 5 cm Assessment Sensory level: T6

## 2016-02-04 NOTE — Brief Op Note (Signed)
02/03/2016 - 02/04/2016  7:31 AM  PATIENT:  Linda [name]  40 y.o. female  PRE-OPERATIVE DIAGNOSIS:  preeclampsia, breech presentation,  IUP @ 37 2/7 weeks  POST-OPERATIVE DIAGNOSIS:  preeclampsia, breech presentation, IUP @ 37 2/7 weeks  PROCEDURE:  Primary cesarean section, kerr hysterotomy  SURGEON:  Surgeon(s) and Role:    * Servando Salina, MD - Primary  PHYSICIAN ASSISTANT:   ASSISTANTS: Derrell Lolling, CNM   ANESTHESIA:   spinal Findings: clear AF, live female complete breech, nl tubes , ovaries( small). Left uteroovarian ligament with one cm fibroid adjacent to blood supply  EBL:  Total I/O In: 700 [I.V.:700] Out: 675 [Urine:75; Blood:600]  BLOOD ADMINISTERED:none  DRAINS: none   LOCAL MEDICATIONS USED:  MARCAINE     SPECIMEN:  Source of Specimen:  placenta  DISPOSITION OF SPECIMEN:  N/A  COUNTS:  YES  TOURNIQUET:  * No tourniquets in log *  DICTATION: .Other Dictation: Dictation Number [id number]  PLAN OF CARE: Admit to inpatient   PATIENT DISPOSITION:  PACU - hemodynamically stable.   Delay start of Pharmacological VTE agent (>24hrs) due to surgical blood loss or risk of bleeding: no

## 2016-02-04 NOTE — Anesthesia Preprocedure Evaluation (Signed)
Anesthesia Evaluation  Patient identified by MRN, date of birth, ID band Patient awake    Reviewed: Allergy & Precautions, H&P , Patient's Chart, lab work & pertinent test results  Airway Mallampati: I  TM Distance: >3 FB Neck ROM: full    Dental no notable dental hx.    Pulmonary neg pulmonary ROS,    Pulmonary exam normal        Cardiovascular negative cardio ROS Normal cardiovascular exam     Neuro/Psych negative neurological ROS     GI/Hepatic negative GI ROS, Neg liver ROS,   Endo/Other  negative endocrine ROS  Renal/GU negative Renal ROS     Musculoskeletal   Abdominal (+) + obese,   Peds  Hematology negative hematology ROS (+)   Anesthesia Other Findings   Reproductive/Obstetrics (+) Pregnancy                             Anesthesia Physical Anesthesia Plan  ASA: II  Anesthesia Plan: Spinal   Post-op Pain Management:    Induction:   Airway Management Planned:   Additional Equipment:   Intra-op Plan:   Post-operative Plan:   Informed Consent: I have reviewed the patients History and Physical, chart, labs and discussed the procedure including the risks, benefits and alternatives for the proposed anesthesia with the patient or authorized representative who has indicated his/her understanding and acceptance.     Plan Discussed with: CRNA and Surgeon  Anesthesia Plan Comments:         Anesthesia Quick Evaluation

## 2016-02-04 NOTE — Anesthesia Postprocedure Evaluation (Signed)
Anesthesia Post Note  Patient: Linda [name]  Procedure(s) Performed: Procedure(s) (LRB): CESAREAN SECTION (N/A)  Patient location during evaluation: A-ICU Anesthesia Type: Spinal Level of consciousness: awake and alert Pain management: pain level controlled Vital Signs Assessment: post-procedure vital signs reviewed and stable Respiratory status: nonlabored ventilation, patient connected to nasal cannula oxygen and spontaneous breathing Cardiovascular status: stable Postop Assessment: no headache, patient able to bend at knees, no backache, no signs of nausea or vomiting, adequate PO intake and spinal receding Anesthetic complications: no     Last Vitals:  Vitals:   02/04/16 1500 02/04/16 1600  BP: 126/82 110/75  Pulse:  71  Resp:  20  Temp:  36.4 C    Last Pain:  Vitals:   02/04/16 1600  TempSrc: Axillary  PainSc:    Pain Goal:                 [employer]

## 2016-02-04 NOTE — Anesthesia Postprocedure Evaluation (Signed)
Anesthesia Post Note  Patient: Linda [name]  Procedure(s) Performed: Procedure(s) (LRB): CESAREAN SECTION (N/A)  Patient location during evaluation: PACU Anesthesia Type: Spinal Level of consciousness: oriented and awake and alert Pain management: pain level controlled Vital Signs Assessment: post-procedure vital signs reviewed and stable Respiratory status: spontaneous breathing, respiratory function stable and patient connected to nasal cannula oxygen Cardiovascular status: blood pressure returned to baseline and stable Postop Assessment: no headache and no backache Anesthetic complications: no     Last Vitals:  Vitals:   02/04/16 0900 02/04/16 0921  BP:  123/84  Pulse: 71 68  Resp: 18 18  Temp:  36.6 C    Last Pain:  Vitals:   02/04/16 0921  TempSrc: Oral  PainSc:    Pain Goal:                 Linda [name] L

## 2016-02-04 NOTE — Transfer of Care (Signed)
Immediate Anesthesia Transfer of Care Note  Patient: Linda [name]  Procedure(s) Performed: Procedure(s): CESAREAN SECTION (N/A)  Patient Location: PACU  Anesthesia Type:Spinal  Level of Consciousness: awake, alert  and oriented  Airway & Oxygen Therapy: Patient Spontanous Breathing  Post-op Assessment: Report given to RN and Post -op Vital signs reviewed and stable  Post vital signs: Reviewed and stable  Last Vitals:  Vitals:   02/04/16 0341 02/04/16 0741  BP: 140/87 128/74  Pulse: 83 75  Resp:  19  Temp:      Last Pain:  Vitals:   02/04/16 0741  TempSrc:   PainSc: 0-No pain         Complications: No apparent anesthesia complications

## 2016-02-05 LAB — COMPREHENSIVE METABOLIC PANEL
ALT: 18 U/L (ref 14–54)
AST: 26 U/L (ref 15–41)
Albumin: 2.4 g/dL — ABNORMAL LOW (ref 3.5–5.0)
Alkaline Phosphatase: 106 U/L (ref 38–126)
Anion gap: 6 (ref 5–15)
BUN: 13 mg/dL (ref 6–20)
CO2: 23 mmol/L (ref 22–32)
Calcium: 6.6 mg/dL — ABNORMAL LOW (ref 8.9–10.3)
Chloride: 103 mmol/L (ref 101–111)
Creatinine, Ser: 0.79 mg/dL (ref 0.44–1.00)
GFR calc Af Amer: >60 mL/min (ref >60)
GFR calc non Af Amer: >60 mL/min (ref >60)
Glucose, Bld: 99 mg/dL (ref 65–99)
Potassium: 3.9 mmol/L (ref 3.5–5.1)
Sodium: 132 mmol/L — ABNORMAL LOW (ref 135–145)
Total Bilirubin: 0.3 mg/dL (ref 0.3–1.2)
Total Protein: 5.2 g/dL — ABNORMAL LOW (ref 6.5–8.1)

## 2016-02-05 LAB — CBC
HCT: 29.1 % — ABNORMAL LOW (ref 36.0–46.0)
Hemoglobin: 9.6 g/dL — ABNORMAL LOW (ref 12.0–15.0)
MCH: 28.7 pg (ref 26.0–34.0)
MCHC: 33 g/dL (ref 30.0–36.0)
MCV: 86.9 fL (ref 78.0–100.0)
Platelets: 210 10*3/uL (ref 150–400)
RBC: 3.35 MIL/uL — ABNORMAL LOW (ref 3.87–5.11)
RDW: 13.4 % (ref 11.5–15.5)
WBC: 14.3 10*3/uL — ABNORMAL HIGH (ref 4.0–10.5)

## 2016-02-05 LAB — MAGNESIUM: Magnesium: 5.7 mg/dL — ABNORMAL HIGH (ref 1.7–2.4)

## 2016-02-05 LAB — BIRTH TISSUE RECOVERY COLLECTION (PLACENTA DONATION)

## 2016-02-05 NOTE — Clinical Social Work Maternal (Addendum)
  CLINICAL SOCIAL WORK MATERNAL/CHILD NOTE  Patient Details  Name: Linda [name] MRN: [id number] Date of Birth: [birth year]  Date:  02/05/2016  Clinical Social Worker Initiating Note:  Rigoberto Noel, LCSW Date/ Time Initiated:  02/05/16/1434     Child's Name:  Linda [name]   Legal Guardian:  Mother   Need for Interpreter:  None   Date of Referral:  02/04/16     Reason for Referral:  Behavioral Health Issues, including SI  (hx of bipolar)   Referral Source:  Central Nursery   Address:  [place]  Phone number:  [phone]   Household Members:  Spouse, Self   Natural Supports (not living in the home):  Friends, Extended Family   Professional Supports: None   Employment: Full-time   Type of Work: Nurse, learning disability at [employer]   Education:  Engineer, maintenance Resources:  Multimedia programmer   Other Resources:   (None reported)   Cultural/Religious Considerations Which May Impact Care:  None reported  Strengths:  Ability to meet basic needs , Home prepared for child    Risk Factors/Current Problems:  None   Cognitive State:  Able to Concentrate    Mood/Affect:  Calm , Happy    CSW Assessment: CSW met with MOB to complete assessment. MOB agreed CSW to complete with others present. MOB confirmed mental health history. She reported that she was receiving no current psychiatric resources and receiving no psychotropic medications at this time. MOB declined referral to counseling or psychiatrist at this time. MOB reported supportive family and friends, basic needs met, adequate resources and no financial difficulties. MOB presented optimistic and engaged in discussion with CSW.  CSW Plan/Description:  No Further Intervention Required/No Barriers to Discharge    Essie Christine, LCSW 02/05/2016, 2:38 PM

## 2016-02-05 NOTE — Progress Notes (Signed)
POSTOPERATIVE DAY # 1 S/P CS / preeclampsia  S:         Reports feeling sore / no PIH symptoms             Tolerating po intake / no nausea / no vomiting / + flatus / no BM             Bleeding is light             Pain controlled with motrin and percocet             Up ad lib / ambulatory/ voiding QS  O:  VS: BP 111/68 (BP Location: Right Arm)   Pulse 73   Temp 97.9 F (36.6 C) (Oral)   Resp 18   Ht 5\' 5"  (1.651 m)   Wt 186 lb (84.4 kg)   LMP 04/17/2015 (Approximate)   SpO2 92%   Breastfeeding? Unknown   BMI 30.95 kg/m               BP: 109-126 / 44-82  LABS:  SGOT 26 / SGPT 18 / creatinine 0.79              Recent Labs  02/04/16 0012 02/05/16 0525  WBC 14.2* 14.3*  HGB 11.2* 9.6*  PLT 196 210               Bloodtype: --/--/B POS, B POS (08/04 0012)  Rubella: Immune (01/27 0000)                                          I&O:  Net + [id number] with slight increase in outpt this am             Physical Exam:             Alert and Oriented X3  Lungs: Clear and unlabored  Heart: regular rate and rhythm / no mumurs  Abdomen: soft, non-tender, non-distended, active BS             Fundus: firm, non-tender, U-1             Dressing intact              Incision:  approximated with suture / no erythema / no ecchymosis / no drainage  Perineum: intact  Lochia: light  Extremities: 2+ pedaledema, no calf pain or tenderness, SCD in place  A:        POD # 1 S/P CS            Preeclampsia on magnesium sulfate prophylaxis            No significant diuresis yet / labs and BP stable  P:        Routine postoperative care              Per Dr Garwin Brothers - continue magnesium             Re-evaluate this PM   Linda [name] CNM, MSN, [facility] 02/05/2016, 9:53 AM

## 2016-02-05 NOTE — Lactation Note (Signed)
This note was copied from a baby's chart. Lactation Consultation Note  Patient Name: Linda [name] [id number] Date: 02/05/2016 Reason for consult: Follow-up assessment;Breast surgery Infant is 9 hours old & seen by [facility] for latch assistance. LC was called by nurse tech because mom was going to try to BF. Baby was asleep with mom when [facility] entered so encouraged mom to undress baby to try to wake her. Baby did wake up so mom tried latching baby on left breast in cross-cradle hold; LC showed mom how to apply the nipple shield but mom had difficulty inverting the tip to help draw her nipple into the shield - encouraged mom to continue trying & that it may help at future feeds to run shield under warm water before applying it to make it be a little easier to work with. Baby latched after a few attempts & stayed latched for a little while; baby's mouth was not very wide so encouraged mom to unlatch & relatch once she had a wider mouth. When baby came off, some breastmilk was noted in the nipple shield. Mom wanted to try BF in football hold on her right breast so mom again tried applying the nipple shield but had difficultly. Baby latched again with nipple shield after a few attempts and baby suckled. A few swallows were noted but baby took many pauses and would sometimes move towards just the tip of the nipple shield. Baby fussed a few times with the nipple shield in her mouth so tried removing the nipple shield but mom's nipples inverted with compression so baby did not suckle for long before fussing again. With the nipple shield in place again, baby continued to fuss so mom put baby on her upper chest to calm her. Mom asked if she could pump since baby calmed down so LC helped mom get set up with DEBP on the initiate setting. Mom pumped & colostrum was seen soon after starting; LC left while pump was on so unsure of how much mom got. Encouraged mom to continue working on BF at least every 3-3.5hrs or sooner with cues  and to follow BF with pumping on the initiate setting & hand expression while someone feeds the EBM from the previous pumping session. Showed mom feeding guidelines of how much to supplement with after BF on the Late Preterm Infant handout.  Encouraged mom to call for LC at future feedings if she needs further assistance. Mom reports no questions at this time.  Maternal Data    Feeding Feeding Type: Breast Fed Length of feed: 8 min  LATCH Score/Interventions Latch: Repeated attempts needed to sustain latch, nipple held in mouth throughout feeding, stimulation needed to elicit sucking reflex. Intervention(s): Assist with latch;Adjust position;Breast compression;Breast massage  Audible Swallowing: A few with stimulation Intervention(s): Skin to skin;Alternate breast massage  Type of Nipple: Flat (nipples invert when breast tissue compressed) Intervention(s): Double electric pump (nipple shield)  Comfort (Breast/Nipple): Soft / non-tender     Hold (Positioning): Assistance needed to correctly position infant at breast and maintain latch. Intervention(s): Breastfeeding basics reviewed;Support Pillows;Position options  LATCH Score: 6  Lactation Tools Discussed/Used Tools: Nipple Shields Nipple shield size: 20 Breast pump type: Double-Electric Breast Pump   Consult Status Consult Status: Follow-up Date: 02/06/16 Follow-up type: In-patient    Yvonna Alanis 02/05/2016, 2:19 PM

## 2016-02-05 NOTE — Lactation Note (Signed)
This note was copied from a baby's chart. Lactation Consultation Note  Patient Name: Linda [name] [id number] Date: 02/05/2016 Reason for consult: Follow-up assessment;Breast surgery Infant is 20 hours old & seen by [facility] for follow-up assessment. Baby was with a visitor when [facility] entered & mom stated baby had BF ~9:30am for ~15 mins with the nipple shield. Mom had questions about how to know whether baby was getting any milk while BF & so discussed listening/ watching for swallows and that once her milk volume increases (by day 3-5) she should notice that her breasts feel softer after BF. Mom stated she has been pumping occ and received a few mL last time she pumped and gave that to her baby.  Mom stated that she was taught hand expression yesterday and may need review later since she was tired when the [facility] was helping her. Reviewed how to clean pump parts & encouraged mom to pump after each feeding (~q 2-3 hrs) on initiate setting to protect her supply since she is using the nipple shield.  Mom reports no further questions at this time. Encouraged mom to ask her RN to call for lactation at her next feed for assistance with latch.   Maternal Data    Feeding Feeding Type: Breast Fed  LATCH Score/Interventions Latch: Repeated attempts needed to sustain latch, nipple held in mouth throughout feeding, stimulation needed to elicit sucking reflex. Intervention(s): Adjust position;Assist with latch;Breast massage;Breast compression (shield)  Audible Swallowing: Spontaneous and intermittent Intervention(s): Skin to skin;Hand expression  Type of Nipple: Flat Intervention(s): Double electric pump (shield)  Comfort (Breast/Nipple): Soft / non-tender     Hold (Positioning): Assistance needed to correctly position infant at breast and maintain latch. Intervention(s): Breastfeeding basics reviewed;Support Pillows;Position options;Skin to skin  LATCH Score: 7  Lactation Tools Discussed/Used Nipple  shield size: 20 Breast pump type: Double-Electric Breast Pump   Consult Status Consult Status: Follow-up Date: 02/06/16 Follow-up type: In-patient    Yvonna Alanis 02/05/2016, 11:05 AM

## 2016-02-05 NOTE — Clinical Social Work Note (Signed)
CSW attempted to complete assessment with MOB. Baby was in room fussy. CSW will attempt assessment later.  Rigoberto Noel, MSW, LCSW Clinical Social Worker

## 2016-02-05 NOTE — Progress Notes (Signed)
Pt requesting to bottle feed her baby with formula. States she is frustrated & concerned that baby is not getting enough from her. She states that she discussed it with her husband & he agrees. RN encouraged pt to continue pumping. Formula given with instructions on amounts & positioning of baby.

## 2016-02-05 NOTE — Progress Notes (Signed)
Reviewed I & O with nurse. Good diuresing since this am( over 2430ml) magnesium sulfate discontinued.

## 2016-02-06 MED ORDER — CALCIUM CARBONATE ANTACID 500 MG PO CHEW
1.0000 | CHEWABLE_TABLET | ORAL | Status: DC | PRN
  Administered 2016-02-06: 400 mg via ORAL
  Administered 2016-02-07: 200 mg via ORAL
  Filled 2016-02-06 (×3): qty 1

## 2016-02-06 NOTE — Progress Notes (Signed)
SVD: primary  S:  Pt reports feeling well/ Tolerating po/ Voiding without problems/ No n/v/ Bleeding is light/ Pain controlled withnarcotic analgesics including Percocet No flatus as yet   O:  A & O x 3  stable/ VS: Blood pressure 120/72, pulse 65, temperature 97.9 F (36.6 C), temperature source Oral, resp. rate 18, height 5\' 5"  (1.651 m), weight 84.8 kg (187 lb), last menstrual period 04/17/2015, SpO2 99 %, unknown if currently breastfeeding.  LABS: No results found for this or any previous visit (from the past 24 hour(s)).  I&O: I/O last 3 completed shifts: In: 8114.8 [P.O.:5650; I.V.:2414.8; Other:50] Out: [id number] [Urine:4600]   No intake/output data recorded.  Lungs: chest clear, no wheezing, rales, normal symmetric air entry, Heart exam - S1, S2 normal, no murmur, no gallop, rate regular  Heart: regular rate and rhythm  Abdomen: soft (+) BS uterus @ umb firm surg tender  Perineum: is normal  Lochia: light/mod  Extremities:edema 1-2+    A/P: POD # 2/PPD # 2/ [id number] s/p magnesium for preeclampsia  Doing well  Continue routine post partum orders  Ambulate D/c home in am

## 2016-02-06 NOTE — Lactation Note (Signed)
This note was copied from a baby's chart. Lactation Consultation Note  Patient Name: Linda [name] [id number] Date: 02/06/2016 Reason for consult: Follow-up assessment;Infant < 6lbs   Follow up with first time mom of 63 hour old infant. Infant with 2 BF for 20 minutes using # 20 NS, 6 bottle feeds of EBM/formula of 2-18 cc, 6 voids and 5 stools. Mom reports she has switched to formula as she felt like she was starving the baby. She reports she pumped yesterday and has not pumped today. She says she may pump later today. Discussed supply and demand and engorgement prevention/treatment. Mom reports she does not feel engorged today. Mom declined assistance with BF Enc her to call with questions/concerns prn.    Maternal Data    Feeding Feeding Type: Bottle Fed - Formula Nipple Type: Slow - flow  LATCH Score/Interventions                      Lactation Tools Discussed/Used WIC Program: No Pump Review: Setup, frequency, and cleaning   Consult Status Consult Status: PRN Follow-up type: Call as needed    Donn Pierini 02/06/2016, 9:31 AM

## 2016-02-07 MED ORDER — IBUPROFEN 600 MG PO TABS: 600.0000 mg | ORAL_TABLET | Freq: Four times a day (QID) | ORAL | 0 refills | Status: AC | PRN

## 2016-02-07 MED ORDER — OXYCODONE HCL 5 MG PO TABS: 5.0000 mg | ORAL_TABLET | ORAL | 0 refills | Status: DC | PRN

## 2016-02-07 NOTE — Progress Notes (Signed)
POSTOPERATIVE DAY # 3 S/P CS   S:         Reports feeling ok - some burning left side of incision             Tolerating po intake / no nausea / no vomiting / + flatus / + BM             Bleeding is light             Pain controlled with motrin and oxycodone             Up ad lib / ambulatory/ voiding QS  Newborn breast feeding     O:  VS: BP 129/70 (BP Location: Right Arm)   Pulse 60   Temp 98.7 F (37.1 C) (Oral)   Resp 18   Ht 5\' 5"  (1.651 m)   Wt 84.8 kg (187 lb)   LMP 04/17/2015 (Approximate)   SpO2 98%   Breastfeeding? Unknown   BMI 31.12 kg/m    LABS:               Recent Labs  02/05/16 0525  WBC 14.3*  HGB 9.6*  PLT 210               Bloodtype: --/--/B POS, B POS (08/04 0012)  Rubella: Immune (01/27 0000)                                             I&O: Intake/Output      08/06 0701 - 08/07 0700 08/07 0701 - 08/08 0700   P.O.     I.V. (mL/kg)     Total Intake(mL/kg)     Urine (mL/kg/hr) 950 (0.5)    Total Output 950     Net -950                       Physical Exam:             Alert and Oriented X3  Lungs: Clear and unlabored  Heart: regular rate and rhythm / no mumurs             Breast - filling / righ axilla with extra-numerary gland leaking breastmilk  Abdomen: soft, non-tender, non-distended              Fundus: firm, non-tender, Ueven             Dressing intact honecymb              Incision:  approximated with suture / no erythema / no ecchymosis / no drainage  Perineum: intact  Lochia: light  Extremities: trace edema, no calf pain or tenderness, negative Homans  A:        POD # 3 S/P CS             P:        Routine postoperative care              DC home     Artelia Laroche CNM, MSN, [place] 02/07/2016, 2:47 PM

## 2016-02-07 NOTE — Lactation Note (Signed)
This note was copied from a baby's chart. Lactation Consultation Note  Patient Name: Linda [name] [id number] Date: 02/07/2016   Per RN, mom is currently pumping. Patient declines [place] visit.  Matthias Hughs [place] 02/07/2016, 8:45 AM

## 2016-02-07 NOTE — Progress Notes (Signed)
Patient discharged home with husband and infant. Discharge instructions for infant and mother reviewed. Prescription given to patient. No questions at this time.

## 2016-02-07 NOTE — Progress Notes (Signed)
POSTOPERATIVE DAY # 3 S/P CS   In bathroom   O:  VS: BP 137/78 (BP Location: Left Arm)   Pulse 68   Temp 98.5 F (36.9 C) (Oral)   Resp 20   Ht 5\' 5"  (1.651 m)   Wt 84.8 kg (187 lb)   LMP 04/17/2015 (Approximate)   SpO2 98%   Breastfeeding? Unknown   BMI 31.12 kg/m    LABS:               Recent Labs  02/05/16 0525  WBC 14.3*  HGB 9.6*  PLT 210               Bloodtype: --/--/B POS, B POS (08/04 0012)  Rubella: Immune (01/27 0000)                     A:        POD # 3 S/P CS          P:        Return to see for rounds later AM / lunch     Artelia Laroche CNM, MSN, [facility] 02/07/2016, 9:49 AM

## 2016-02-07 NOTE — Discharge Summary (Signed)
OB Discharge Summary  Patient Name: Linda [name] DOB: 05-17-1976 MRN: [id number]  Date of admission: 02/03/2016  Admitting diagnosis: IUP @ 64 2/7 weeks / breech / preeclamspia Intrauterine pregnancy: [redacted]w[redacted]d       Date of discharge: 02/07/2016    Discharge diagnosis: Preterm delivered  / preeclampsia-delivered     Prenatal history: G2P1011   EDC : 02/23/2016, Alternate EDD Entry  Prenatal care at [facility]  Primary provider : Evonte Prestage Prenatal course complicated by breech / preeclampsia  Prenatal Labs: ABO, Rh: --/--/B POS, B POS (08/04 0012)  Antibody: NEG (08/04 0012) Rubella: Immune (01/27 0000)   RPR: Non Reactive (08/04 0012)  HBsAg: Negative (01/27 0000)  HIV: Non-reactive (01/27 0000)  GBS: Negative (07/29 0000)                                    Hospital course:  Sceduled C/S   40 y.o. yo G2P1011 at [redacted]w[redacted]d was admitted to the hospital 02/03/2016 for scheduled cesarean section with the following indication:Malpresentation and preeclampsia.  Membrane Rupture Time/Date: 6:54 AM ,02/04/2016   Patient delivered a Viable infant.02/04/2016  Details of operation can be found in separate operative note.  Pateint had an uncomplicated postpartum course.  She is ambulating, tolerating a regular diet, passing flatus, and urinating well. Patient is discharged home in stable condition on  02/07/16        Delivering PROVIDER: Selwyn Reason                                                            Complications: None  Newborn Data: Live born female  Birth Weight: 6 lb 3.7 oz (2825 g) APGAR: 8, 10  Baby Feeding: Breast Disposition:home with mother  Post partum procedures:none  Postpartum contraception: Not Discussed    Labs: Lab Results  Component Value Date   WBC 14.3 (H) 02/05/2016   HGB 9.6 (L) 02/05/2016   HCT 29.1 (L) 02/05/2016   MCV 86.9 02/05/2016   PLT 210 02/05/2016   CMP Latest Ref Rng & Units 02/05/2016  Glucose 65 - 99 mg/dL 99  BUN  6 - 20 mg/dL 13  Creatinine 0.44 - 1.00 mg/dL 0.79  Sodium 135 - 145 mmol/L 132(L)  Potassium 3.5 - 5.1 mmol/L 3.9  Chloride 101 - 111 mmol/L 103  CO2 22 - 32 mmol/L 23  Calcium 8.9 - 10.3 mg/dL 6.6(L)  Total Protein 6.5 - 8.1 g/dL 5.2(L)  Total Bilirubin 0.3 - 1.2 mg/dL 0.3  Alkaline Phos 38 - 126 U/L 106  AST 15 - 41 U/L 26  ALT 14 - 54 U/L 18    Physical Exam @ time of discharge:  Vitals:   02/06/16 1831 02/06/16 2206 02/07/16 0602 02/07/16 1244  BP: 136/71 (!) 122/44 137/78 129/70  Pulse: (!) 58 63 68 60  Resp: 18 18 20 18   Temp: 98.1 F (36.7 C) 98.2 F (36.8 C) 98.5 F (36.9 C) 98.7 F (37.1 C)  TempSrc: Oral Oral Oral Oral  SpO2: 98% 99% 98% 98%  Weight:      Height:        General: alert, cooperative and no distress Lochia: appropriate Uterine Fundus: firm Perineum:  intact Incision: Healing well with no significant drainage Extremities: DVT Evaluation: No evidence of DVT seen on physical exam.   Discharge instructions:  "Baby and Me Booklet" and [place] Booklet  Discharge Medications:    Medication List    STOP taking these medications   calcium carbonate 500 MG chewable tablet Commonly known as:  TUMS - dosed in mg elemental calcium     TAKE these medications   acetaminophen 325 MG tablet Commonly known as:  TYLENOL Take 650 mg by mouth every 6 (six) hours as needed for mild pain.   esomeprazole 40 MG capsule Commonly known as:  NEXIUM Take 40 mg by mouth daily at 12 noon.   ibuprofen 600 MG tablet Commonly known as:  ADVIL,MOTRIN Take 1 tablet (600 mg total) by mouth every 6 (six) hours as needed for mild pain.   oxyCODONE 5 MG immediate release tablet Commonly known as:  Oxy IR/ROXICODONE Take 1 tablet (5 mg total) by mouth every 4 (four) hours as needed (pain scale 4-7).   prenatal multivitamin Tabs tablet Take 1 tablet by mouth daily at 12 noon.       Diet: routine diet  Activity: Advance as tolerated. Pelvic rest x 6 weeks.    Follow up:6 weeks postpartum BP check with [facility] nursing   Signed: Artelia Laroche CNM, MSN, [facility] 02/07/2016, 2:56 PM

## 2016-02-07 NOTE — Progress Notes (Signed)
Pt c/o SOB at rest and with acitivity, V/s taken see flowsheet (WNL). Incentive spirometer and TCDB was performed. Pt states she has not pumped for several hours and she feels really full. States she is tired and really doesn't want to pump. RN educated pt again on when to pump, benefits and the s/s of engorgement. Pt states she will pump instructed to pump for 20 min and give BM to the baby. Pt agreed to plan. Will continue to monitor.

## 2016-02-11 DIAGNOSIS — O1493 Unspecified pre-eclampsia, third trimester: Secondary | ICD-10-CM | POA: Diagnosis not present

## 2016-02-17 ENCOUNTER — Inpatient Hospital Stay (HOSPITAL_COMMUNITY)
Admission: RE | Admit: 2016-02-17 | Payer: BLUE CROSS/BLUE SHIELD | Source: Ambulatory Visit | Admitting: Obstetrics and Gynecology

## 2016-02-17 SURGERY — Surgical Case
Anesthesia: Spinal

## 2016-03-17 DIAGNOSIS — Z1151 Encounter for screening for human papillomavirus (HPV): Secondary | ICD-10-CM | POA: Diagnosis not present

## 2016-03-17 DIAGNOSIS — Z130 Encounter for screening for diseases of the blood and blood-forming organs and certain disorders involving the immune mechanism: Secondary | ICD-10-CM | POA: Diagnosis not present

## 2016-04-06 ENCOUNTER — Other Ambulatory Visit: Payer: Self-pay | Admitting: Obstetrics and Gynecology

## 2016-04-06 ENCOUNTER — Ambulatory Visit
Admission: RE | Admit: 2016-04-06 | Discharge: 2016-04-06 | Disposition: A | Discharge status: Home / Self Care | Payer: BLUE CROSS/BLUE SHIELD | Source: Ambulatory Visit | Attending: Obstetrics and Gynecology | Admitting: Obstetrics and Gynecology

## 2016-04-06 DIAGNOSIS — N63 Unspecified lump in unspecified breast: Secondary | ICD-10-CM

## 2016-04-06 DIAGNOSIS — N6489 Other specified disorders of breast: Secondary | ICD-10-CM | POA: Diagnosis not present

## 2016-04-06 DIAGNOSIS — R2231 Localized swelling, mass and lump, right upper limb: Secondary | ICD-10-CM

## 2016-05-14 DIAGNOSIS — Z23 Encounter for immunization: Secondary | ICD-10-CM | POA: Diagnosis not present

## 2016-05-19 DIAGNOSIS — J029 Acute pharyngitis, unspecified: Secondary | ICD-10-CM | POA: Diagnosis not present

## 2016-05-19 DIAGNOSIS — J020 Streptococcal pharyngitis: Secondary | ICD-10-CM | POA: Diagnosis not present

## 2016-05-19 DIAGNOSIS — Z7689 Persons encountering health services in other specified circumstances: Secondary | ICD-10-CM | POA: Diagnosis not present

## 2016-05-29 DIAGNOSIS — R197 Diarrhea, unspecified: Secondary | ICD-10-CM | POA: Diagnosis not present

## 2016-05-29 DIAGNOSIS — R1084 Generalized abdominal pain: Secondary | ICD-10-CM | POA: Diagnosis not present

## 2016-05-29 DIAGNOSIS — R110 Nausea: Secondary | ICD-10-CM | POA: Diagnosis not present

## 2016-06-01 DIAGNOSIS — K5100 Ulcerative (chronic) pancolitis without complications: Secondary | ICD-10-CM | POA: Diagnosis not present

## 2016-06-01 DIAGNOSIS — A0472 Enterocolitis due to Clostridium difficile, not specified as recurrent: Secondary | ICD-10-CM | POA: Diagnosis not present

## 2016-06-01 DIAGNOSIS — Z79899 Other long term (current) drug therapy: Secondary | ICD-10-CM | POA: Diagnosis not present

## 2016-06-01 DIAGNOSIS — R319 Hematuria, unspecified: Secondary | ICD-10-CM | POA: Diagnosis not present

## 2016-06-01 DIAGNOSIS — K5120 Ulcerative (chronic) proctitis without complications: Secondary | ICD-10-CM | POA: Diagnosis not present

## 2016-06-01 DIAGNOSIS — R197 Diarrhea, unspecified: Secondary | ICD-10-CM | POA: Diagnosis not present

## 2016-06-01 DIAGNOSIS — K921 Melena: Secondary | ICD-10-CM | POA: Diagnosis not present

## 2016-06-01 DIAGNOSIS — K529 Noninfective gastroenteritis and colitis, unspecified: Secondary | ICD-10-CM | POA: Diagnosis not present

## 2016-06-01 DIAGNOSIS — R112 Nausea with vomiting, unspecified: Secondary | ICD-10-CM | POA: Diagnosis not present

## 2016-06-01 DIAGNOSIS — Z880 Allergy status to penicillin: Secondary | ICD-10-CM | POA: Diagnosis not present

## 2016-06-02 DIAGNOSIS — K529 Noninfective gastroenteritis and colitis, unspecified: Secondary | ICD-10-CM | POA: Diagnosis not present

## 2016-06-02 DIAGNOSIS — A0472 Enterocolitis due to Clostridium difficile, not specified as recurrent: Secondary | ICD-10-CM | POA: Diagnosis not present

## 2016-06-02 DIAGNOSIS — K5100 Ulcerative (chronic) pancolitis without complications: Secondary | ICD-10-CM | POA: Diagnosis not present

## 2016-06-02 DIAGNOSIS — K5120 Ulcerative (chronic) proctitis without complications: Secondary | ICD-10-CM | POA: Diagnosis not present

## 2016-06-03 DIAGNOSIS — K529 Noninfective gastroenteritis and colitis, unspecified: Secondary | ICD-10-CM | POA: Diagnosis not present

## 2016-06-03 DIAGNOSIS — R112 Nausea with vomiting, unspecified: Secondary | ICD-10-CM | POA: Diagnosis not present

## 2016-06-03 DIAGNOSIS — A0472 Enterocolitis due to Clostridium difficile, not specified as recurrent: Secondary | ICD-10-CM | POA: Diagnosis not present

## 2016-06-03 DIAGNOSIS — R197 Diarrhea, unspecified: Secondary | ICD-10-CM | POA: Diagnosis not present

## 2016-06-04 DIAGNOSIS — A0472 Enterocolitis due to Clostridium difficile, not specified as recurrent: Secondary | ICD-10-CM | POA: Diagnosis not present

## 2016-06-05 DIAGNOSIS — R112 Nausea with vomiting, unspecified: Secondary | ICD-10-CM | POA: Diagnosis not present

## 2016-06-05 DIAGNOSIS — A0472 Enterocolitis due to Clostridium difficile, not specified as recurrent: Secondary | ICD-10-CM | POA: Diagnosis not present

## 2016-06-05 DIAGNOSIS — K529 Noninfective gastroenteritis and colitis, unspecified: Secondary | ICD-10-CM | POA: Diagnosis not present

## 2016-06-05 DIAGNOSIS — R197 Diarrhea, unspecified: Secondary | ICD-10-CM | POA: Diagnosis not present

## 2016-06-06 DIAGNOSIS — A0472 Enterocolitis due to Clostridium difficile, not specified as recurrent: Secondary | ICD-10-CM | POA: Diagnosis not present

## 2016-06-06 DIAGNOSIS — R197 Diarrhea, unspecified: Secondary | ICD-10-CM | POA: Diagnosis not present

## 2016-06-06 DIAGNOSIS — R112 Nausea with vomiting, unspecified: Secondary | ICD-10-CM | POA: Diagnosis not present

## 2016-06-06 DIAGNOSIS — K529 Noninfective gastroenteritis and colitis, unspecified: Secondary | ICD-10-CM | POA: Diagnosis not present

## 2016-06-07 DIAGNOSIS — A0472 Enterocolitis due to Clostridium difficile, not specified as recurrent: Secondary | ICD-10-CM | POA: Diagnosis not present

## 2016-06-07 DIAGNOSIS — R112 Nausea with vomiting, unspecified: Secondary | ICD-10-CM | POA: Diagnosis not present

## 2016-06-07 DIAGNOSIS — K529 Noninfective gastroenteritis and colitis, unspecified: Secondary | ICD-10-CM | POA: Diagnosis not present

## 2016-06-07 DIAGNOSIS — R197 Diarrhea, unspecified: Secondary | ICD-10-CM | POA: Diagnosis not present

## 2016-06-12 DIAGNOSIS — J00 Acute nasopharyngitis [common cold]: Secondary | ICD-10-CM | POA: Diagnosis not present

## 2016-06-12 DIAGNOSIS — J029 Acute pharyngitis, unspecified: Secondary | ICD-10-CM | POA: Diagnosis not present

## 2016-06-16 DIAGNOSIS — Z0000 Encounter for general adult medical examination without abnormal findings: Secondary | ICD-10-CM | POA: Diagnosis not present

## 2016-06-16 DIAGNOSIS — F900 Attention-deficit hyperactivity disorder, predominantly inattentive type: Secondary | ICD-10-CM | POA: Diagnosis not present

## 2016-06-28 DIAGNOSIS — F329 Major depressive disorder, single episode, unspecified: Secondary | ICD-10-CM | POA: Diagnosis not present

## 2016-06-28 DIAGNOSIS — Z880 Allergy status to penicillin: Secondary | ICD-10-CM | POA: Diagnosis not present

## 2016-06-28 DIAGNOSIS — K5100 Ulcerative (chronic) pancolitis without complications: Secondary | ICD-10-CM | POA: Diagnosis not present

## 2016-06-28 DIAGNOSIS — Z79899 Other long term (current) drug therapy: Secondary | ICD-10-CM | POA: Diagnosis not present

## 2016-06-28 DIAGNOSIS — K529 Noninfective gastroenteritis and colitis, unspecified: Secondary | ICD-10-CM | POA: Diagnosis not present

## 2016-06-28 DIAGNOSIS — R197 Diarrhea, unspecified: Secondary | ICD-10-CM | POA: Diagnosis not present

## 2016-06-28 DIAGNOSIS — R110 Nausea: Secondary | ICD-10-CM | POA: Diagnosis not present

## 2016-06-28 DIAGNOSIS — R630 Anorexia: Secondary | ICD-10-CM | POA: Diagnosis not present

## 2016-06-28 DIAGNOSIS — Z8619 Personal history of other infectious and parasitic diseases: Secondary | ICD-10-CM | POA: Diagnosis not present

## 2016-06-29 DIAGNOSIS — R197 Diarrhea, unspecified: Secondary | ICD-10-CM | POA: Diagnosis not present

## 2016-06-29 DIAGNOSIS — R1084 Generalized abdominal pain: Secondary | ICD-10-CM | POA: Diagnosis not present

## 2016-06-29 DIAGNOSIS — A0471 Enterocolitis due to Clostridium difficile, recurrent: Secondary | ICD-10-CM | POA: Diagnosis not present

## 2016-07-29 DIAGNOSIS — Z880 Allergy status to penicillin: Secondary | ICD-10-CM | POA: Diagnosis not present

## 2016-07-29 DIAGNOSIS — Z8619 Personal history of other infectious and parasitic diseases: Secondary | ICD-10-CM | POA: Diagnosis not present

## 2016-07-29 DIAGNOSIS — Z79899 Other long term (current) drug therapy: Secondary | ICD-10-CM | POA: Diagnosis not present

## 2016-07-29 DIAGNOSIS — R197 Diarrhea, unspecified: Secondary | ICD-10-CM | POA: Diagnosis not present

## 2016-07-29 DIAGNOSIS — R1084 Generalized abdominal pain: Secondary | ICD-10-CM | POA: Diagnosis not present

## 2016-07-31 DIAGNOSIS — R1084 Generalized abdominal pain: Secondary | ICD-10-CM | POA: Diagnosis not present

## 2016-07-31 DIAGNOSIS — A0471 Enterocolitis due to Clostridium difficile, recurrent: Secondary | ICD-10-CM | POA: Diagnosis not present

## 2016-08-09 ENCOUNTER — Emergency Department (HOSPITAL_COMMUNITY): Payer: BLUE CROSS/BLUE SHIELD

## 2016-08-09 ENCOUNTER — Encounter (HOSPITAL_COMMUNITY): Payer: Self-pay | Admitting: Emergency Medicine

## 2016-08-09 ENCOUNTER — Emergency Department (HOSPITAL_COMMUNITY)
Admission: EM | Admit: 2016-08-09 | Discharge: 2016-08-09 | Disposition: A | Discharge status: Home / Self Care | Payer: BLUE CROSS/BLUE SHIELD | Attending: Emergency Medicine | Admitting: Emergency Medicine

## 2016-08-09 DIAGNOSIS — N83202 Unspecified ovarian cyst, left side: Secondary | ICD-10-CM

## 2016-08-09 DIAGNOSIS — R1032 Left lower quadrant pain: Secondary | ICD-10-CM | POA: Diagnosis present

## 2016-08-09 DIAGNOSIS — Z79899 Other long term (current) drug therapy: Secondary | ICD-10-CM | POA: Diagnosis not present

## 2016-08-09 DIAGNOSIS — R102 Pelvic and perineal pain: Secondary | ICD-10-CM

## 2016-08-09 DIAGNOSIS — N83201 Unspecified ovarian cyst, right side: Secondary | ICD-10-CM | POA: Diagnosis not present

## 2016-08-09 LAB — CBC
HCT: 40.9 % (ref 36.0–46.0)
Hemoglobin: 13.6 g/dL (ref 12.0–15.0)
MCH: 30.1 pg (ref 26.0–34.0)
MCHC: 33.3 g/dL (ref 30.0–36.0)
MCV: 90.5 fL (ref 78.0–100.0)
Platelets: 312 10*3/uL (ref 150–400)
RBC: 4.52 MIL/uL (ref 3.87–5.11)
RDW: 12.7 % (ref 11.5–15.5)
WBC: 10.2 10*3/uL (ref 4.0–10.5)

## 2016-08-09 LAB — URINALYSIS, ROUTINE W REFLEX MICROSCOPIC
Bilirubin Urine: NEGATIVE
Glucose, UA: NEGATIVE mg/dL
Hgb urine dipstick: NEGATIVE
Ketones, ur: NEGATIVE mg/dL
Leukocytes, UA: NEGATIVE
Nitrite: NEGATIVE
Protein, ur: NEGATIVE mg/dL
Specific Gravity, Urine: 1.023 (ref 1.005–1.030)
pH: 6 (ref 5.0–8.0)

## 2016-08-09 LAB — WET PREP, GENITAL
Clue Cells Wet Prep HPF POC: NONE SEEN
Sperm: NONE SEEN
Trich, Wet Prep: NONE SEEN
Yeast Wet Prep HPF POC: NONE SEEN

## 2016-08-09 LAB — COMPREHENSIVE METABOLIC PANEL
ALT: 21 U/L (ref 14–54)
AST: 21 U/L (ref 15–41)
Albumin: 4.2 g/dL (ref 3.5–5.0)
Alkaline Phosphatase: 55 U/L (ref 38–126)
Anion gap: 9 (ref 5–15)
BUN: 15 mg/dL (ref 6–20)
CO2: 21 mmol/L — ABNORMAL LOW (ref 22–32)
Calcium: 9.1 mg/dL (ref 8.9–10.3)
Chloride: 107 mmol/L (ref 101–111)
Creatinine, Ser: 0.79 mg/dL (ref 0.44–1.00)
GFR calc Af Amer: >60 mL/min (ref >60)
GFR calc non Af Amer: >60 mL/min (ref >60)
Glucose, Bld: 103 mg/dL — ABNORMAL HIGH (ref 65–99)
Potassium: 4 mmol/L (ref 3.5–5.1)
Sodium: 137 mmol/L (ref 135–145)
Total Bilirubin: 0.4 mg/dL (ref 0.3–1.2)
Total Protein: 6.4 g/dL — ABNORMAL LOW (ref 6.5–8.1)

## 2016-08-09 LAB — PREGNANCY, URINE: Preg Test, Ur: NEGATIVE

## 2016-08-09 LAB — LIPASE, BLOOD: Lipase: 43 U/L (ref 11–51)

## 2016-08-09 MED ORDER — OXYCODONE HCL 5 MG PO TABS: 5.0000 mg | ORAL_TABLET | ORAL | 0 refills | Status: AC | PRN

## 2016-08-09 NOTE — ED Triage Notes (Signed)
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  Patient states she is currently on "difficile" (abx for c-diff) and yesterday had a stabbing/shooting pain in the left lower side of her abdomen which "felt like an organ being shifted".  PAtient states tonight she went to turn over to listen to the baby monitor and she experienced a similar but stronger pain that then radiated from the left to the right side of her abdomen.  Pt then had nausea, vomiting, and chills.  Pt denies any recent diarrhea.  Pt endorses 1 episode of vomiting, and tender to palpation in central lower abdomen.

## 2016-08-09 NOTE — ED Notes (Signed)
Patient transported to Ultrasound 

## 2016-08-09 NOTE — ED Provider Notes (Signed)
[place] DEPT Provider Note   CSN: [id number] Arrival date & time: 08/09/16  0245     History   Chief Complaint Chief Complaint  Patient presents with  . Abdominal Pain    HPI Linda [name] is a 41 y.o. female.  HPI Patient presents with left lower abdominal pain starting yesterday evening. Describes the pain as sharp radiating to the right side. States she got out of the bed and became lightheaded and nauseated. She had some spasms of her fingers which resolved. Denies any recent diarrhea. Had normal bowel movement this morning. No blood in stool. No fever or chills. Patient says she's taking antibiotic for C. difficile and symptoms have generally improved. Denies recent vaginal discharge or bleeding. Last menstrual period 1-2 weeks ago. Past Medical History:  Diagnosis Date  . Bipolar disorder ([place]) 2008  . History of abnormal Pap smear 1/01   mild dysplasia, HPV  . Infertility     Patient Active Problem List   Diagnosis Date Noted  . Cesarean delivery delivered 02/04/2016  . Postpartum care following cesarean delivery (8/4) 02/04/2016  . Preeclampsia 02/04/2016  . Preeclampsia in postpartum period 02/04/2016    Past Surgical History:  Procedure Laterality Date  . AUGMENTATION MAMMAPLASTY Bilateral 6/07   implants  . CESAREAN SECTION N/A 02/04/2016   Procedure: CESAREAN SECTION;  Surgeon: Servando Salina, MD;  Location: [place];  Service: Obstetrics;  Laterality: N/A;  . IVF Transfer 06/07/2015 N/A 06/07/2015  . THROAT SURGERY  1998   throat absess  . TONSILLECTOMY  1998    OB History    Gravida Para Term Preterm AB Living   2 1 1   1 1    SAB TAB Ectopic Multiple Live Births   1     0 1       Home Medications    Prior to Admission medications   Medication Sig Start Date End Date Taking? Authorizing Provider  acidophilus (RISAQUAD) CAPS capsule Take 1 capsule by mouth daily.   Yes Historical Provider, MD  DIFICID 200 MG TABS tablet Take 200  mg by mouth 2 (two) times daily. 08/01/16 08/11/16 Yes Historical Provider, MD  methylphenidate 36 MG PO CR tablet Take 36 mg by mouth daily as needed. focus 07/13/16  Yes Historical Provider, MD  ondansetron (ZOFRAN-ODT) 4 MG disintegrating tablet Take 4 mg by mouth every 8 (eight) hours as needed for nausea/vomiting. 06/29/16  Yes Historical Provider, MD  ibuprofen (ADVIL,MOTRIN) 600 MG tablet Take 1 tablet (600 mg total) by mouth every 6 (six) hours as needed for mild pain. Patient not taking: Reported on 08/09/2016 02/07/16   Artelia Laroche, CNM  oxyCODONE (OXY IR/ROXICODONE) 5 MG immediate release tablet Take 1 tablet (5 mg total) by mouth every 4 (four) hours as needed for severe pain. 08/09/16   Julianne Rice, MD    Family History Family History  Problem Relation Age of Onset  . Hyperlipidemia Mother   . COPD Mother   . Esophageal cancer Maternal Aunt 52    esophageal, smoker  . Diabetes Maternal Grandfather   . Cancer Paternal Grandmother     melanoma  . Dementia Paternal Grandmother   . Lung cancer Paternal Grandfather     lung    Social History Social History  Substance Use Topics  . Smoking status: Never Smoker  . Smokeless tobacco: Never Used  . Alcohol use Yes     Comment: social     Allergies   Penicillins and Phenergan [promethazine hcl]  Review of Systems Review of Systems  Constitutional: Negative for chills, fatigue and fever.  HENT: Negative for congestion, nosebleeds and sore throat.   Respiratory: Negative for cough and shortness of breath.   Cardiovascular: Negative for chest pain.  Gastrointestinal: Positive for abdominal pain and nausea. Negative for blood in stool, constipation, diarrhea and vomiting.  Genitourinary: Positive for pelvic pain. Negative for dysuria, flank pain, frequency, hematuria, vaginal bleeding and vaginal discharge.  Musculoskeletal: Negative for back pain, myalgias, neck pain and neck stiffness.  Skin: Negative for rash and wound.    Neurological: Positive for dizziness and light-headedness. Negative for syncope, weakness, numbness and headaches.  All other systems reviewed and are negative.    Physical Exam Updated Vital Signs BP 103/72   Pulse 69   Temp 98.3 F (36.8 C)   Resp 16   Ht 5\' 5"  (1.651 m)   Wt 140 lb (63.5 kg)   SpO2 97%   BMI 23.30 kg/m   Physical Exam  Constitutional: She is oriented to person, place, and time. She appears well-developed and well-nourished. No distress.  Mildly drowsy  HENT:  Head: Normocephalic and atraumatic.  Mouth/Throat: Oropharynx is clear and moist. No oropharyngeal exudate.  Eyes: EOM are normal. Pupils are equal, round, and reactive to light.  Neck: Normal range of motion. Neck supple.  Cardiovascular: Normal rate and regular rhythm.  Exam reveals no gallop and no friction rub.   No murmur heard. Pulmonary/Chest: Effort normal and breath sounds normal. No respiratory distress. She has no wheezes. She has no rales. She exhibits no tenderness.  Abdominal: Soft. Bowel sounds are normal. She exhibits no distension. There is tenderness (that appears most pronounced in the left lower quadrant and left adnexal region.). There is no rebound and no guarding.  Genitourinary:  Genitourinary Comments: No cervical motion tenderness. White vaginal discharge. Mild left adnexal tenderness.  Musculoskeletal: Normal range of motion. She exhibits no edema or tenderness.  No CVA tenderness bilaterally. No lower extremity swelling, asymmetry or tenderness.  Neurological: She is alert and oriented to person, place, and time.  Moving all extremities without deficit. Sensation is fully intact.  Skin: Skin is warm and dry. Capillary refill takes less than 2 seconds. No rash noted. No erythema.  Psychiatric: She has a normal mood and affect. Her behavior is normal.  Nursing note and vitals reviewed.    ED Treatments / Results  Labs (all labs ordered are listed, but only abnormal  results are displayed) Labs Reviewed  WET PREP, GENITAL - Abnormal; Notable for the following:       Result Value   WBC, Wet Prep HPF POC MODERATE (*)    All other components within normal limits  COMPREHENSIVE METABOLIC PANEL - Abnormal; Notable for the following:    CO2 21 (*)    Glucose, Bld 103 (*)    Total Protein 6.4 (*)    All other components within normal limits  URINALYSIS, ROUTINE W REFLEX MICROSCOPIC - Abnormal; Notable for the following:    APPearance HAZY (*)    All other components within normal limits  LIPASE, BLOOD  CBC  PREGNANCY, URINE  GC/CHLAMYDIA PROBE AMP ([place]) NOT AT [facility]    EKG  EKG Interpretation None       Radiology US Transvaginal Non-ob  Result Date: 08/09/2016 CLINICAL DATA:  Left lower quadrant abdominal pain. Nausea and vomiting. Chills. Recent C-section. EXAM: TRANSABDOMINAL AND TRANSVAGINAL ULTRASOUND OF PELVIS DOPPLER ULTRASOUND OF OVARIES TECHNIQUE: Both transabdominal and transvaginal ultrasound  examinations of the pelvis were performed. Transabdominal technique was performed for global imaging of the pelvis including uterus, ovaries, adnexal regions, and pelvic cul-de-sac. It was necessary to proceed with endovaginal exam following the transabdominal exam to visualize the endometrium and ovaries. Color and duplex Doppler ultrasound was utilized to evaluate blood flow to the ovaries. COMPARISON:  None. FINDINGS: Uterus Measurements: 8.2 x 5.0 x 5.3 cm. 9.0 cm, within normal limits Endometrium Thickness: 9.0 mm, within normal limits. No focal abnormality visualized. Right ovary Measurements: 3.5 x 2.5 x 2.9 cm, within normal limits. A complex cyst measures 2.1 x 2.2 x 2.2 cm. Left ovary Measurements: 2.5 x 1.4 x 2.9 cm, within normal limits. A heterogeneous hypoechoic area measures 2.5 x 1.4 x 2.9 cm, likely a hemorrhagic cyst. Pulsed Doppler evaluation of both ovaries demonstrates normal low-resistance arterial and venous waveforms. Other  findings No abnormal free fluid. IMPRESSION: 1. Complex bilateral ovarian cysts measuring less than 3 cm. This is almost certainly benign, and no specific imaging follow up is recommended according to the Society of Radiologists in [place] Statement (D Clovis Riley et al. Management of Asymptomatic Ovarian and Other Adnexal Cysts Imaged at [place]: Society of Radiologists in [place] Statement 2010. Radiology 256 (Sept 2010): 943-954.). 2. Normal sonographic appearance of the uterus. Electronically Signed   By: San Morelle M.D.   On: 08/09/2016 10:30   US Pelvis Complete  Result Date: 08/09/2016 CLINICAL DATA:  Left lower quadrant abdominal pain. Nausea and vomiting. Chills. Recent C-section. EXAM: TRANSABDOMINAL AND TRANSVAGINAL ULTRASOUND OF PELVIS DOPPLER ULTRASOUND OF OVARIES TECHNIQUE: Both transabdominal and transvaginal ultrasound examinations of the pelvis were performed. Transabdominal technique was performed for global imaging of the pelvis including uterus, ovaries, adnexal regions, and pelvic cul-de-sac. It was necessary to proceed with endovaginal exam following the transabdominal exam to visualize the endometrium and ovaries. Color and duplex Doppler ultrasound was utilized to evaluate blood flow to the ovaries. COMPARISON:  None. FINDINGS: Uterus Measurements: 8.2 x 5.0 x 5.3 cm. 9.0 cm, within normal limits Endometrium Thickness: 9.0 mm, within normal limits. No focal abnormality visualized. Right ovary Measurements: 3.5 x 2.5 x 2.9 cm, within normal limits. A complex cyst measures 2.1 x 2.2 x 2.2 cm. Left ovary Measurements: 2.5 x 1.4 x 2.9 cm, within normal limits. A heterogeneous hypoechoic area measures 2.5 x 1.4 x 2.9 cm, likely a hemorrhagic cyst. Pulsed Doppler evaluation of both ovaries demonstrates normal low-resistance arterial and venous waveforms. Other findings No abnormal free fluid. IMPRESSION: 1. Complex bilateral ovarian cysts  measuring less than 3 cm. This is almost certainly benign, and no specific imaging follow up is recommended according to the Society of Radiologists in [place] Statement (D Clovis Riley et al. Management of Asymptomatic Ovarian and Other Adnexal Cysts Imaged at [place]: Society of Radiologists in [place] Statement 2010. Radiology 256 (Sept 2010): 943-954.). 2. Normal sonographic appearance of the uterus. Electronically Signed   By: San Morelle M.D.   On: 08/09/2016 10:30   [place] Art/ven Flow Abd Pelv Doppler  Result Date: 08/09/2016 CLINICAL DATA:  Left lower quadrant abdominal pain. Nausea and vomiting. Chills. Recent C-section. EXAM: TRANSABDOMINAL AND TRANSVAGINAL ULTRASOUND OF PELVIS DOPPLER ULTRASOUND OF OVARIES TECHNIQUE: Both transabdominal and transvaginal ultrasound examinations of the pelvis were performed. Transabdominal technique was performed for global imaging of the pelvis including uterus, ovaries, adnexal regions, and pelvic cul-de-sac. It was necessary to proceed with endovaginal exam following the transabdominal exam to visualize the endometrium and  ovaries. Color and duplex Doppler ultrasound was utilized to evaluate blood flow to the ovaries. COMPARISON:  None. FINDINGS: Uterus Measurements: 8.2 x 5.0 x 5.3 cm. 9.0 cm, within normal limits Endometrium Thickness: 9.0 mm, within normal limits. No focal abnormality visualized. Right ovary Measurements: 3.5 x 2.5 x 2.9 cm, within normal limits. A complex cyst measures 2.1 x 2.2 x 2.2 cm. Left ovary Measurements: 2.5 x 1.4 x 2.9 cm, within normal limits. A heterogeneous hypoechoic area measures 2.5 x 1.4 x 2.9 cm, likely a hemorrhagic cyst. Pulsed Doppler evaluation of both ovaries demonstrates normal low-resistance arterial and venous waveforms. Other findings No abnormal free fluid. IMPRESSION: 1. Complex bilateral ovarian cysts measuring less than 3 cm. This is almost certainly benign, and no  specific imaging follow up is recommended according to the Society of Radiologists in [place] Statement (D Clovis Riley et al. Management of Asymptomatic Ovarian and Other Adnexal Cysts Imaged at [place]: Society of Radiologists in [place] Statement 2010. Radiology 256 (Sept 2010): 943-954.). 2. Normal sonographic appearance of the uterus. Electronically Signed   By: San Morelle M.D.   On: 08/09/2016 10:30    Procedures Procedures (including critical care time)  Medications Ordered in ED Medications - No data to display   Initial Impression / Assessment and Plan / ED Course  I have reviewed the triage vital signs and the nursing notes.  Pertinent labs & imaging results that were available during my care of the patient were reviewed by me and considered in my medical decision making (see chart for details).    Patient has a benign abdominal exam. Complex hemorrhagic cyst on the left ovary. Likely responsible for the patient's symptoms. Advised to follow-up with her OB/GYN. Return precautions given.   Final Clinical Impressions(s) / ED Diagnoses   Final diagnoses:  Pelvic pain  Hemorrhagic cyst of left ovary    New Prescriptions Current Discharge Medication List       Julianne Rice, MD 08/09/16 1140

## 2016-08-10 LAB — GC/CHLAMYDIA PROBE AMP (~~LOC~~) NOT AT ARMC
Chlamydia: NEGATIVE
Neisseria Gonorrhea: NEGATIVE

## 2016-09-04 DIAGNOSIS — A0472 Enterocolitis due to Clostridium difficile, not specified as recurrent: Secondary | ICD-10-CM | POA: Diagnosis not present

## 2016-09-06 DIAGNOSIS — F5102 Adjustment insomnia: Secondary | ICD-10-CM | POA: Diagnosis not present

## 2016-09-06 DIAGNOSIS — F331 Major depressive disorder, recurrent, moderate: Secondary | ICD-10-CM | POA: Diagnosis not present

## 2016-10-12 DIAGNOSIS — R05 Cough: Secondary | ICD-10-CM | POA: Diagnosis not present

## 2016-10-12 DIAGNOSIS — J302 Other seasonal allergic rhinitis: Secondary | ICD-10-CM | POA: Diagnosis not present

## 2016-10-12 DIAGNOSIS — F988 Other specified behavioral and emotional disorders with onset usually occurring in childhood and adolescence: Secondary | ICD-10-CM | POA: Diagnosis not present

## 2016-10-12 DIAGNOSIS — F331 Major depressive disorder, recurrent, moderate: Secondary | ICD-10-CM | POA: Diagnosis not present

## 2016-10-18 DIAGNOSIS — J4530 Mild persistent asthma, uncomplicated: Secondary | ICD-10-CM | POA: Diagnosis not present

## 2016-10-18 DIAGNOSIS — R05 Cough: Secondary | ICD-10-CM | POA: Diagnosis not present

## 2017-01-15 DIAGNOSIS — R197 Diarrhea, unspecified: Secondary | ICD-10-CM | POA: Diagnosis not present

## 2017-01-16 DIAGNOSIS — R197 Diarrhea, unspecified: Secondary | ICD-10-CM | POA: Diagnosis not present

## 2017-02-02 ENCOUNTER — Encounter (HOSPITAL_BASED_OUTPATIENT_CLINIC_OR_DEPARTMENT_OTHER): Payer: Self-pay | Admitting: *Deleted

## 2017-02-02 ENCOUNTER — Emergency Department (HOSPITAL_BASED_OUTPATIENT_CLINIC_OR_DEPARTMENT_OTHER)
Admission: EM | Admit: 2017-02-02 | Discharge: 2017-02-02 | Disposition: A | Discharge status: Home / Self Care | Payer: BLUE CROSS/BLUE SHIELD | Attending: Emergency Medicine | Admitting: Emergency Medicine

## 2017-02-02 ENCOUNTER — Emergency Department (HOSPITAL_BASED_OUTPATIENT_CLINIC_OR_DEPARTMENT_OTHER): Payer: BLUE CROSS/BLUE SHIELD

## 2017-02-02 DIAGNOSIS — Z79899 Other long term (current) drug therapy: Secondary | ICD-10-CM | POA: Diagnosis not present

## 2017-02-02 DIAGNOSIS — F410 Panic disorder [episodic paroxysmal anxiety] without agoraphobia: Secondary | ICD-10-CM

## 2017-02-02 DIAGNOSIS — R51 Headache: Secondary | ICD-10-CM | POA: Diagnosis not present

## 2017-02-02 DIAGNOSIS — F411 Generalized anxiety disorder: Secondary | ICD-10-CM | POA: Diagnosis not present

## 2017-02-02 DIAGNOSIS — R4182 Altered mental status, unspecified: Secondary | ICD-10-CM | POA: Diagnosis not present

## 2017-02-02 HISTORY — DX: Attention-deficit hyperactivity disorder, unspecified type: F90.9

## 2017-02-02 LAB — BASIC METABOLIC PANEL
Anion gap: 12 (ref 5–15)
BUN: 18 mg/dL (ref 6–20)
CO2: 22 mmol/L (ref 22–32)
Calcium: 9.3 mg/dL (ref 8.9–10.3)
Chloride: 102 mmol/L (ref 101–111)
Creatinine, Ser: 0.66 mg/dL (ref 0.44–1.00)
GFR calc Af Amer: >60 mL/min (ref >60)
GFR calc non Af Amer: >60 mL/min (ref >60)
Glucose, Bld: 87 mg/dL (ref 65–99)
Potassium: 3.8 mmol/L (ref 3.5–5.1)
Sodium: 136 mmol/L (ref 135–145)

## 2017-02-02 LAB — TSH: TSH: 1.594 u[IU]/mL (ref 0.350–4.500)

## 2017-02-02 LAB — CBC WITH DIFFERENTIAL/PLATELET
Basophils Absolute: 0 10*3/uL (ref 0.0–0.1)
Basophils Relative: 1 %
Eosinophils Absolute: 0.1 10*3/uL (ref 0.0–0.7)
Eosinophils Relative: 1 %
HCT: 43.3 % (ref 36.0–46.0)
Hemoglobin: 14.7 g/dL (ref 12.0–15.0)
Lymphocytes Relative: 26 %
Lymphs Abs: 1.9 10*3/uL (ref 0.7–4.0)
MCH: 30.6 pg (ref 26.0–34.0)
MCHC: 33.9 g/dL (ref 30.0–36.0)
MCV: 90.2 fL (ref 78.0–100.0)
Monocytes Absolute: 0.7 10*3/uL (ref 0.1–1.0)
Monocytes Relative: 9 %
Neutro Abs: 4.6 10*3/uL (ref 1.7–7.7)
Neutrophils Relative %: 63 %
Platelets: 248 10*3/uL (ref 150–400)
RBC: 4.8 MIL/uL (ref 3.87–5.11)
RDW: 12.7 % (ref 11.5–15.5)
WBC: 7.3 10*3/uL (ref 4.0–10.5)

## 2017-02-02 LAB — PREGNANCY, URINE: Preg Test, Ur: NEGATIVE

## 2017-02-02 MED ORDER — HYDROXYZINE HCL 25 MG PO TABS
25.0000 mg | ORAL_TABLET | Freq: Once | ORAL | Status: AC
  Administered 2017-02-02: 25 mg via ORAL
  Filled 2017-02-02: qty 1

## 2017-02-02 MED ORDER — HYDROXYZINE HCL 25 MG PO TABS: 25.0000 mg | ORAL_TABLET | Freq: Four times a day (QID) | ORAL | 0 refills | Status: AC | PRN

## 2017-02-02 MED ORDER — LORAZEPAM 1 MG PO TABS
1.0000 mg | ORAL_TABLET | Freq: Once | ORAL | Status: AC
  Administered 2017-02-02: 1 mg via ORAL
  Filled 2017-02-02: qty 1

## 2017-02-02 MED ORDER — LORAZEPAM 1 MG PO TABS: 1.0000 mg | ORAL_TABLET | Freq: Three times a day (TID) | ORAL | 0 refills | Status: DC | PRN

## 2017-02-02 MED FILL — hydrOXYzine HCL 25 MG TABS: 25 | 5 days supply | Qty: 20 | Fill #0

## 2017-02-02 MED FILL — LORazepam 1 MG TABS: 1 | 5 days supply | Qty: 15 | Fill #0

## 2017-02-02 NOTE — ED Notes (Signed)
ED Provider at bedside. 

## 2017-02-02 NOTE — ED Provider Notes (Signed)
[facility] Provider Note   CSN: [id number] Arrival date & time: 02/02/17  1426     History   Chief Complaint Chief Complaint  Patient presents with  . Altered Mental Status    HPI Linda [name] is a 41 y.o. female.  HPI   41 year old female with history of bipolar disorder and generalized anxiety here with increasing fatigue and anxiety. The patient recently returned to work after delivering her baby one year ago. The patient states that since returning to work, she feels increasing fatigue and is having difficulty sleeping. She reports that she has multiple open projects at work and has extreme anxiety related to these. She feels overwhelmed at work. She states that over the last week, she has been sleeping no more than 3-4 hours a night and is having increased anxiety. When she is at work, she feels like she is disconnected and has difficulty remembering what she is doing. She went to urgent care and was sent here for evaluation. Denies any difficulty speaking or swallowing, only mild confusion when she feels very anxious. She does occasionally feel short of breath with tingling in her upper and lower extremity strength is sepsis. No recent medication changes. She has been on Zoloft for a few months. No suicidal or homicidal ideation. No hallucinations.  Past Medical History:  Diagnosis Date  . ADHD   . Bipolar disorder ([place]) 2008  . History of abnormal Pap smear 1/01   mild dysplasia, HPV  . Infertility     Patient Active Problem List   Diagnosis Date Noted  . Cesarean delivery delivered 02/04/2016  . Postpartum care following cesarean delivery (8/4) 02/04/2016  . Preeclampsia 02/04/2016  . Preeclampsia in postpartum period 02/04/2016    Past Surgical History:  Procedure Laterality Date  . AUGMENTATION MAMMAPLASTY Bilateral 6/07   implants  . CESAREAN SECTION N/A 02/04/2016   Procedure: CESAREAN SECTION;  Surgeon: Servando Salina, MD;  Location: [facility];  Service: Obstetrics;  Laterality: N/A;  . IVF Transfer 06/07/2015 N/A 06/07/2015  . THROAT SURGERY  1998   throat absess  . TONSILLECTOMY  1998    OB History    Gravida Para Term Preterm AB Living   2 1 1   1 1    SAB TAB Ectopic Multiple Live Births   1     0 1       Home Medications    Prior to Admission medications   Medication Sig Start Date End Date Taking? Authorizing Provider  ALPRAZolam Duanne Moron) 0.25 MG tablet Take 0.25 mg by mouth at bedtime as needed for anxiety.   Yes [provider]  sertraline (ZOLOFT) 100 MG tablet Take 100 mg by mouth daily.   Yes [provider]  acidophilus (RISAQUAD) CAPS capsule Take 1 capsule by mouth daily.    [provider]  hydrOXYzine (ATARAX/VISTARIL) 25 MG tablet Take 1 tablet (25 mg total) by mouth every 6 (six) hours as needed for anxiety (mild anxiety). 02/02/17   Duffy Bruce, MD  ibuprofen (ADVIL,MOTRIN) 600 MG tablet Take 1 tablet (600 mg total) by mouth every 6 (six) hours as needed for mild pain. Patient not taking: Reported on 08/09/2016 02/07/16   Artelia Laroche, CNM  LORazepam (ATIVAN) 1 MG tablet Take 1 tablet (1 mg total) by mouth every 8 (eight) hours as needed for anxiety or sleep (severe anxiety). 02/02/17   Duffy Bruce, MD  methylphenidate 36 MG PO CR tablet Take 36 mg by mouth daily as  needed. focus 07/13/16   [provider]  ondansetron (ZOFRAN-ODT) 4 MG disintegrating tablet Take 4 mg by mouth every 8 (eight) hours as needed for nausea/vomiting. 06/29/16   [provider]  oxyCODONE (OXY IR/ROXICODONE) 5 MG immediate release tablet Take 1 tablet (5 mg total) by mouth every 4 (four) hours as needed for severe pain. 08/09/16   Julianne Rice, MD    Family History Family History  Problem Relation Age of Onset  . Hyperlipidemia Mother   . COPD Mother   . Esophageal cancer Maternal Aunt 52       esophageal, smoker  . Diabetes Maternal Grandfather   . Cancer Paternal  Grandmother        melanoma  . Dementia Paternal Grandmother   . Lung cancer Paternal Grandfather        lung    Social History Social History  Substance Use Topics  . Smoking status: Never Smoker  . Smokeless tobacco: Never Used  . Alcohol use Yes     Comment: social     Allergies   Penicillins and Phenergan [promethazine hcl]   Review of Systems Review of Systems  Constitutional: Positive for fatigue.  Psychiatric/Behavioral: Positive for confusion, decreased concentration and sleep disturbance. The patient is nervous/anxious.   All other systems reviewed and are negative.    Physical Exam Updated Vital Signs BP 115/71 (BP Location: Right Arm)   Pulse 70   Temp 98.1 F (36.7 C) (Oral)   Resp 15   Ht 5\' 5"  (1.651 m)   Wt 64.9 kg (143 lb)   LMP 01/16/2017   SpO2 100%   BMI 23.80 kg/m   Physical Exam  Constitutional: She is oriented to person, place, and time. She appears well-developed and well-nourished. She appears distressed.  Anxious, tearful  HENT:  Head: Normocephalic and atraumatic.  Eyes: Conjunctivae are normal.  Neck: Neck supple.  Cardiovascular: Normal rate, regular rhythm and normal heart sounds.  Exam reveals no friction rub.   No murmur heard. Pulmonary/Chest: Effort normal and breath sounds normal. No respiratory distress. She has no wheezes. She has no rales.  Abdominal: She exhibits no distension.  Musculoskeletal: She exhibits no edema.  Neurological: She is alert and oriented to person, place, and time. She exhibits normal muscle tone.  Skin: Skin is warm. Capillary refill takes less than 2 seconds.  Psychiatric: Her mood appears anxious.  Nursing note and vitals reviewed.    ED Treatments / Results  Labs (all labs ordered are listed, but only abnormal results are displayed) Labs Reviewed  CBC WITH DIFFERENTIAL/PLATELET  BASIC METABOLIC PANEL  TSH  PREGNANCY, URINE    EKG  EKG Interpretation  Date/Time:  Friday February 02 2017 15:20:19 EDT Ventricular Rate:  86 PR Interval:    QRS Duration: 80 QT Interval:  349 QTC Calculation: 418 R Axis:   79 Text Interpretation:  Sinus rhythm No old tracing to compare No ischemic changes Confirmed by Duffy Bruce [id number]) on 02/02/2017 3:32:32 PM Also confirmed by Duffy Bruce [phone]), editor Laurena Spies [phone])  on 02/02/2017 3:57:09 PM       Radiology Ct Head Wo Contrast  Result Date: 02/02/2017 CLINICAL DATA:  Pt complains of headache, feelings of "fogginess" difficulty thinking, emotional, denies n/v and blurred vision, denies weakness, unsure if this is a panic attack Denies chance of pregnancy EXAM: CT HEAD WITHOUT CONTRAST TECHNIQUE: Contiguous axial images were obtained from the base of the skull through the vertex without intravenous contrast. COMPARISON:  None. FINDINGS: Brain: No evidence of acute infarction, hemorrhage, hydrocephalus, extra-axial collection or mass lesion/mass effect. Vascular: No hyperdense vessel or unexpected calcification. Skull: Normal. Negative for fracture or focal lesion. Sinuses/Orbits: No acute finding. Other: None IMPRESSION: Normal exam. Electronically Signed   By: Nolon Nations M.D.   On: 02/02/2017 16:00    Procedures Procedures (including critical care time)  Medications Ordered in ED Medications  LORazepam (ATIVAN) tablet 1 mg (1 mg Oral Given 02/02/17 1541)  hydrOXYzine (ATARAX/VISTARIL) tablet 25 mg (25 mg Oral Given 02/02/17 1541)     Initial Impression / Assessment and Plan / ED Course  I have reviewed the triage vital signs and the nursing notes.  Pertinent labs & imaging results that were available during my care of the patient were reviewed by me and considered in my medical decision making (see chart for details).    41 year old female with history of anxiety and depression here with worsening anxiety in the setting of returning to work after delivery of her baby. I suspect patient has worsening generalized  anxiety with now development of panic attacks. This is likely secondary to multiple recent stressors at home, as well as possible component of postpartum status. Patient denies any SI, HI, or auditory or visual hallucinations. She is goal oriented. Screening lab work is unremarkable. Symptoms are not consistent with stroke and CT head is negative for intracranial lesions. I discussed coping mechanisms and will give brief course of Atarax and Ativan as needed. Patient has no history of substance abuse and [place] controlled substance database was reviewed. Otherwise, will refer to PCP and psychiatry.  This note was prepared with assistance of Systems analyst. Occasional wrong-word or sound-a-like substitutions may have occurred due to the inherent limitations of voice recognition software.   Final Clinical Impressions(s) / ED Diagnoses   Final diagnoses:  Panic attack  Generalized anxiety disorder    New Prescriptions Discharge Medication List as of 02/02/2017  4:57 PM    START taking these medications   Details  hydrOXYzine (ATARAX/VISTARIL) 25 MG tablet Take 1 tablet (25 mg total) by mouth every 6 (six) hours as needed for anxiety (mild anxiety)., Starting Fri 02/02/2017, Print    LORazepam (ATIVAN) 1 MG tablet Take 1 tablet (1 mg total) by mouth every 8 (eight) hours as needed for anxiety or sleep (severe anxiety)., Starting Fri 02/02/2017, Print         Duffy Bruce, MD 02/03/17 0157

## 2017-02-02 NOTE — ED Notes (Signed)
Patient transported to CT 

## 2017-02-02 NOTE — ED Triage Notes (Signed)
Pt c/o "something not right" increased stress at work, pt states she has trouble finding her words and feels like she is in a daze, and confused, sent here from [facility] for EVAL

## 2017-02-06 DIAGNOSIS — F411 Generalized anxiety disorder: Secondary | ICD-10-CM | POA: Diagnosis not present

## 2017-02-13 DIAGNOSIS — F439 Reaction to severe stress, unspecified: Secondary | ICD-10-CM | POA: Diagnosis not present

## 2017-02-15 DIAGNOSIS — F439 Reaction to severe stress, unspecified: Secondary | ICD-10-CM | POA: Diagnosis not present

## 2017-02-19 ENCOUNTER — Other Ambulatory Visit: Payer: Self-pay | Admitting: Obstetrics and Gynecology

## 2017-02-19 DIAGNOSIS — Z1231 Encounter for screening mammogram for malignant neoplasm of breast: Secondary | ICD-10-CM

## 2017-02-20 DIAGNOSIS — F439 Reaction to severe stress, unspecified: Secondary | ICD-10-CM | POA: Diagnosis not present

## 2017-02-20 DIAGNOSIS — F411 Generalized anxiety disorder: Secondary | ICD-10-CM | POA: Diagnosis not present

## 2017-02-21 ENCOUNTER — Ambulatory Visit: Payer: BLUE CROSS/BLUE SHIELD

## 2017-02-23 ENCOUNTER — Ambulatory Visit
Admission: RE | Admit: 2017-02-23 | Discharge: 2017-02-23 | Disposition: A | Discharge status: Home / Self Care | Payer: BLUE CROSS/BLUE SHIELD | Source: Ambulatory Visit | Attending: Obstetrics and Gynecology | Admitting: Obstetrics and Gynecology

## 2017-02-23 ENCOUNTER — Other Ambulatory Visit: Payer: Self-pay | Admitting: Obstetrics and Gynecology

## 2017-02-23 DIAGNOSIS — Z1231 Encounter for screening mammogram for malignant neoplasm of breast: Secondary | ICD-10-CM | POA: Diagnosis not present

## 2017-02-23 DIAGNOSIS — F4323 Adjustment disorder with mixed anxiety and depressed mood: Secondary | ICD-10-CM | POA: Diagnosis not present

## 2017-02-23 DIAGNOSIS — F900 Attention-deficit hyperactivity disorder, predominantly inattentive type: Secondary | ICD-10-CM | POA: Diagnosis not present

## 2017-03-06 DIAGNOSIS — F439 Reaction to severe stress, unspecified: Secondary | ICD-10-CM | POA: Diagnosis not present

## 2017-03-07 DIAGNOSIS — M546 Pain in thoracic spine: Secondary | ICD-10-CM | POA: Diagnosis not present

## 2017-03-07 DIAGNOSIS — M5000 Cervical disc disorder with myelopathy, unspecified cervical region: Secondary | ICD-10-CM | POA: Diagnosis not present

## 2017-03-07 DIAGNOSIS — M545 Low back pain: Secondary | ICD-10-CM | POA: Diagnosis not present

## 2017-03-09 DIAGNOSIS — M542 Cervicalgia: Secondary | ICD-10-CM | POA: Diagnosis not present

## 2017-03-09 DIAGNOSIS — M50322 Other cervical disc degeneration at C5-C6 level: Secondary | ICD-10-CM | POA: Diagnosis not present

## 2017-03-12 DIAGNOSIS — M542 Cervicalgia: Secondary | ICD-10-CM | POA: Diagnosis not present

## 2017-03-12 DIAGNOSIS — M50322 Other cervical disc degeneration at C5-C6 level: Secondary | ICD-10-CM | POA: Diagnosis not present

## 2017-03-13 DIAGNOSIS — F439 Reaction to severe stress, unspecified: Secondary | ICD-10-CM | POA: Diagnosis not present

## 2017-03-19 DIAGNOSIS — M50322 Other cervical disc degeneration at C5-C6 level: Secondary | ICD-10-CM | POA: Diagnosis not present

## 2017-03-19 DIAGNOSIS — M542 Cervicalgia: Secondary | ICD-10-CM | POA: Diagnosis not present

## 2017-03-22 DIAGNOSIS — M50322 Other cervical disc degeneration at C5-C6 level: Secondary | ICD-10-CM | POA: Diagnosis not present

## 2017-03-22 DIAGNOSIS — F439 Reaction to severe stress, unspecified: Secondary | ICD-10-CM | POA: Diagnosis not present

## 2017-03-22 DIAGNOSIS — M542 Cervicalgia: Secondary | ICD-10-CM | POA: Diagnosis not present

## 2017-03-23 DIAGNOSIS — F900 Attention-deficit hyperactivity disorder, predominantly inattentive type: Secondary | ICD-10-CM | POA: Diagnosis not present

## 2017-03-23 DIAGNOSIS — F411 Generalized anxiety disorder: Secondary | ICD-10-CM | POA: Diagnosis not present

## 2017-03-23 DIAGNOSIS — F330 Major depressive disorder, recurrent, mild: Secondary | ICD-10-CM | POA: Diagnosis not present

## 2017-03-29 DIAGNOSIS — F439 Reaction to severe stress, unspecified: Secondary | ICD-10-CM | POA: Diagnosis not present

## 2017-04-09 DIAGNOSIS — F900 Attention-deficit hyperactivity disorder, predominantly inattentive type: Secondary | ICD-10-CM | POA: Diagnosis not present

## 2017-04-09 DIAGNOSIS — F411 Generalized anxiety disorder: Secondary | ICD-10-CM | POA: Diagnosis not present

## 2017-04-09 DIAGNOSIS — F330 Major depressive disorder, recurrent, mild: Secondary | ICD-10-CM | POA: Diagnosis not present

## 2017-04-12 DIAGNOSIS — F439 Reaction to severe stress, unspecified: Secondary | ICD-10-CM | POA: Diagnosis not present

## 2017-04-28 DIAGNOSIS — F439 Reaction to severe stress, unspecified: Secondary | ICD-10-CM | POA: Diagnosis not present

## 2017-05-03 DIAGNOSIS — F439 Reaction to severe stress, unspecified: Secondary | ICD-10-CM | POA: Diagnosis not present

## 2017-05-10 DIAGNOSIS — F330 Major depressive disorder, recurrent, mild: Secondary | ICD-10-CM | POA: Diagnosis not present

## 2017-05-10 DIAGNOSIS — F900 Attention-deficit hyperactivity disorder, predominantly inattentive type: Secondary | ICD-10-CM | POA: Diagnosis not present

## 2017-05-10 DIAGNOSIS — F411 Generalized anxiety disorder: Secondary | ICD-10-CM | POA: Diagnosis not present

## 2017-06-02 DIAGNOSIS — F439 Reaction to severe stress, unspecified: Secondary | ICD-10-CM | POA: Diagnosis not present

## 2017-06-08 DIAGNOSIS — F3342 Major depressive disorder, recurrent, in full remission: Secondary | ICD-10-CM | POA: Diagnosis not present

## 2017-06-08 DIAGNOSIS — F900 Attention-deficit hyperactivity disorder, predominantly inattentive type: Secondary | ICD-10-CM | POA: Diagnosis not present

## 2017-06-08 DIAGNOSIS — F411 Generalized anxiety disorder: Secondary | ICD-10-CM | POA: Diagnosis not present

## 2017-06-23 DIAGNOSIS — F439 Reaction to severe stress, unspecified: Secondary | ICD-10-CM | POA: Diagnosis not present

## 2017-07-07 IMAGING — US US ABDOMEN COMPLETE
1 series · 14 of 25 positions shown · non-contrast
Comparison: No recent prior.

CLINICAL DATA: Right upper quadrant pain.  Pregnancy.

EXAM:
ABDOMEN ULTRASOUND COMPLETE

[Series 1: us abdomen complete · 0.19mm/px · 14 of 88 slices shown]
[im 1/88]
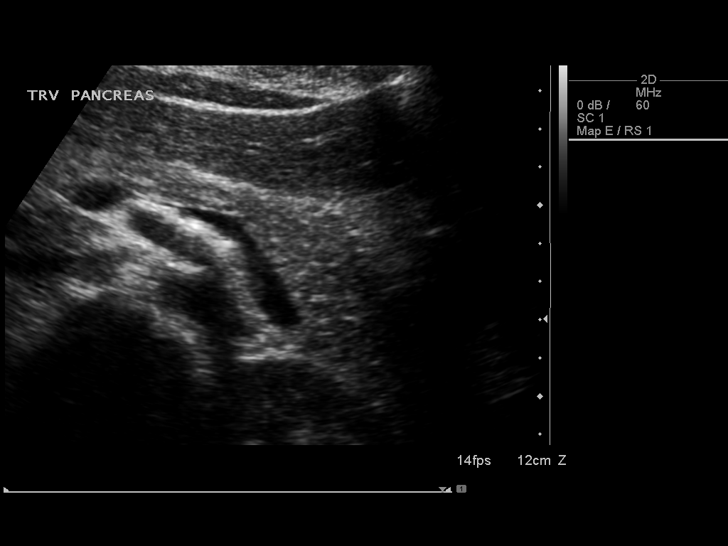
[im 8/88]
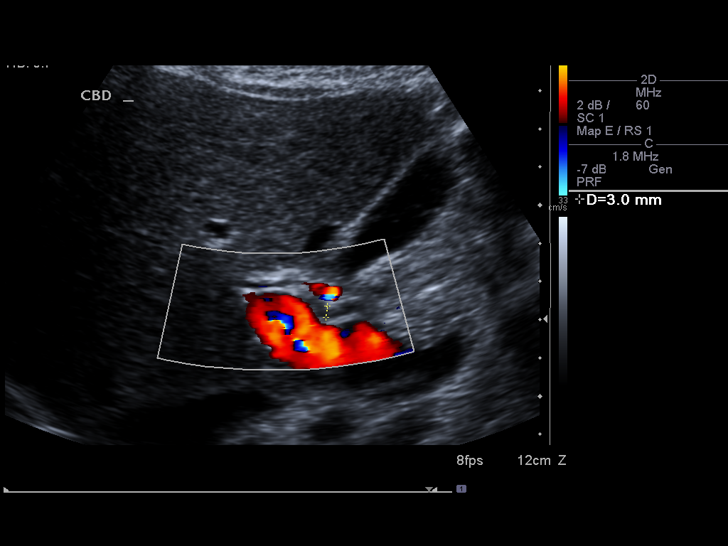
[im 15/88]
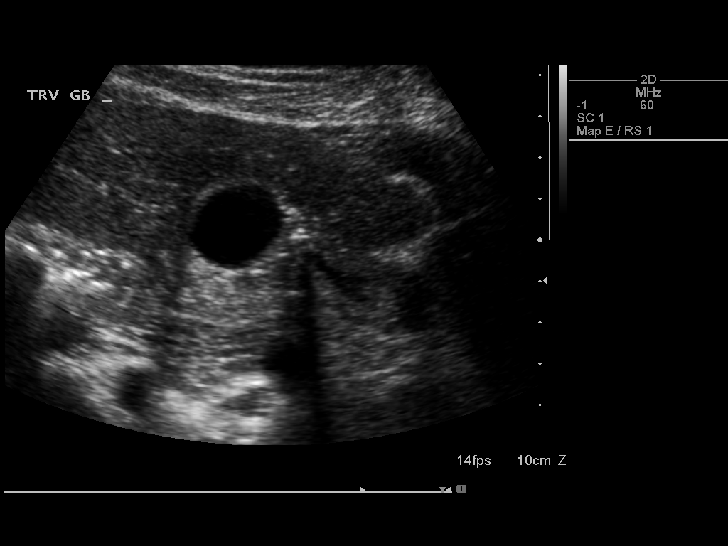
[im 22/88]
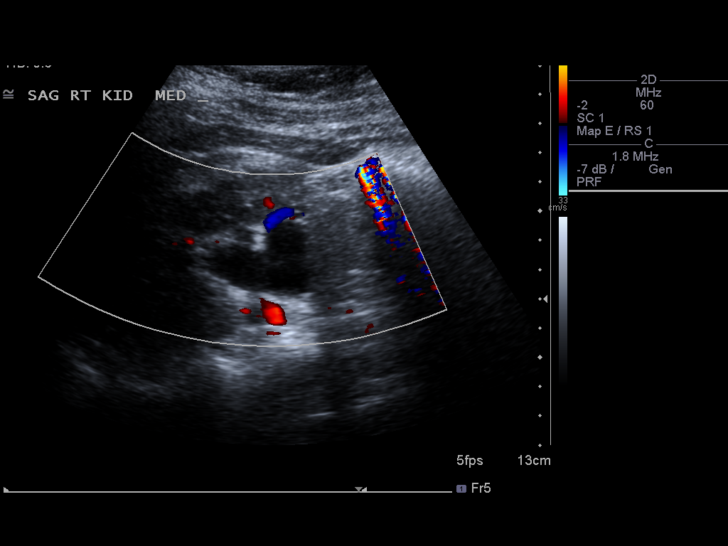
[im 30/88]
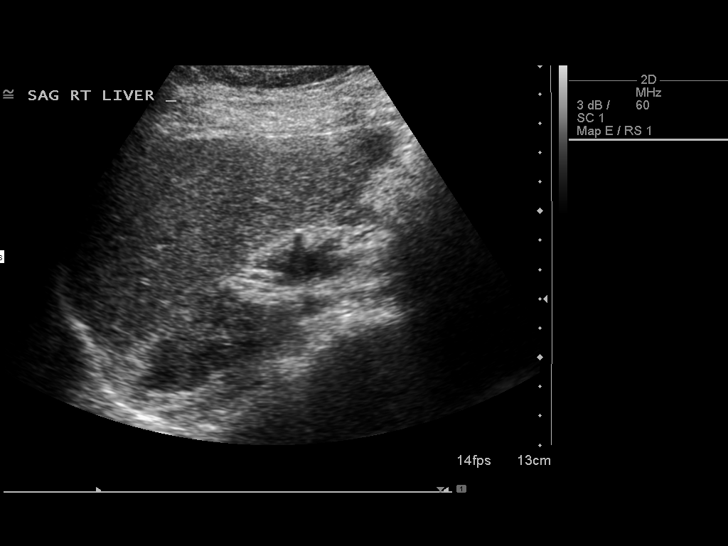
[im 33/88]
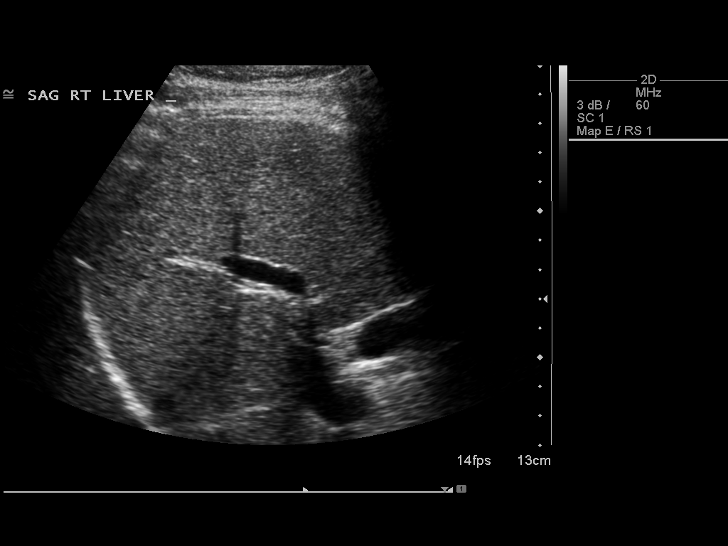
[im 40/88]
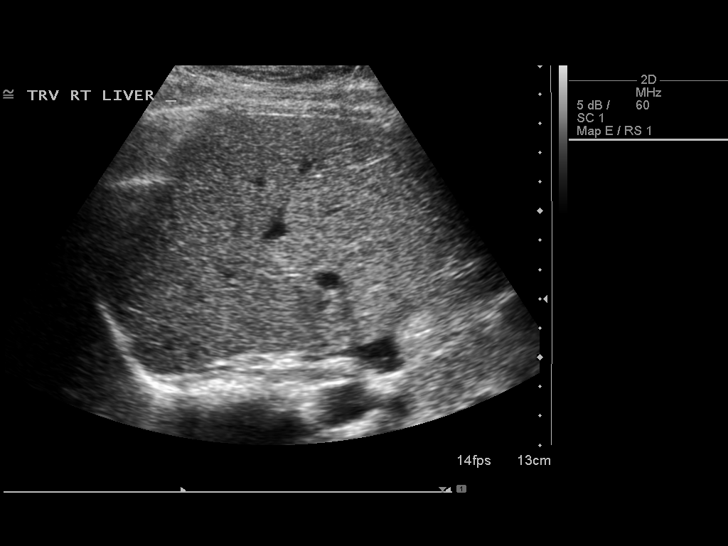
[im 48/88]
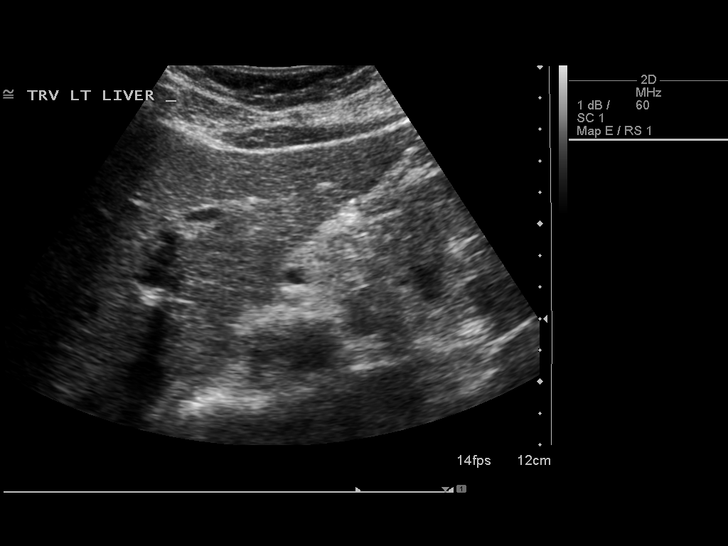
[im 55/88]
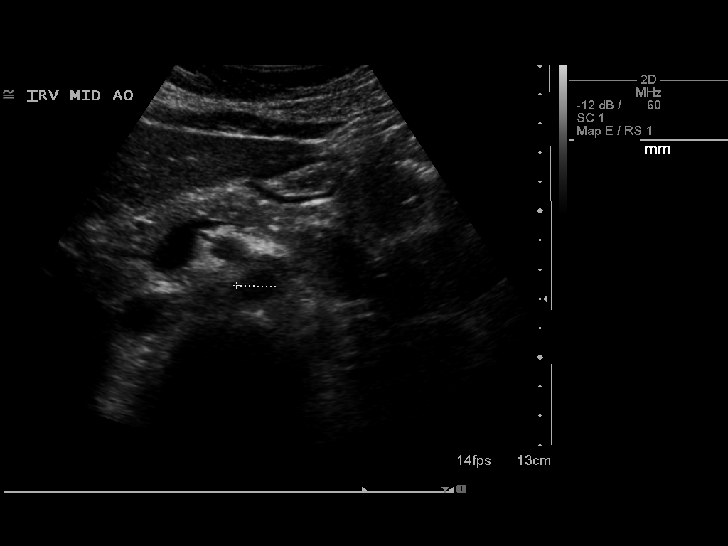
[im 59/88]
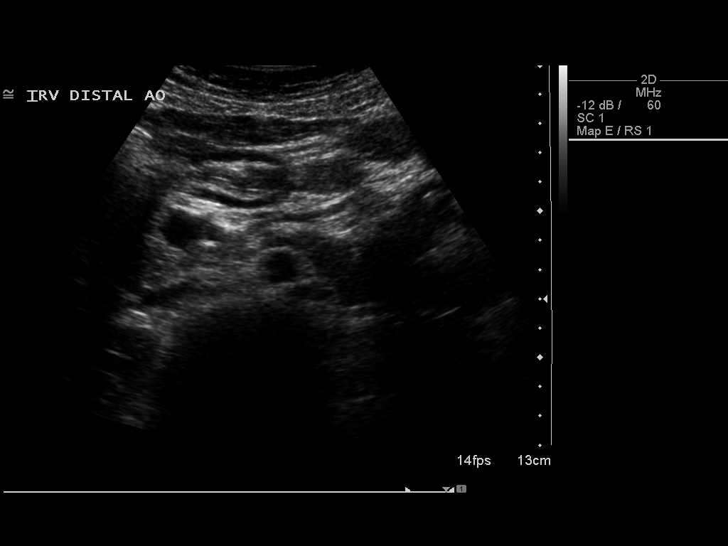
[im 66/88]
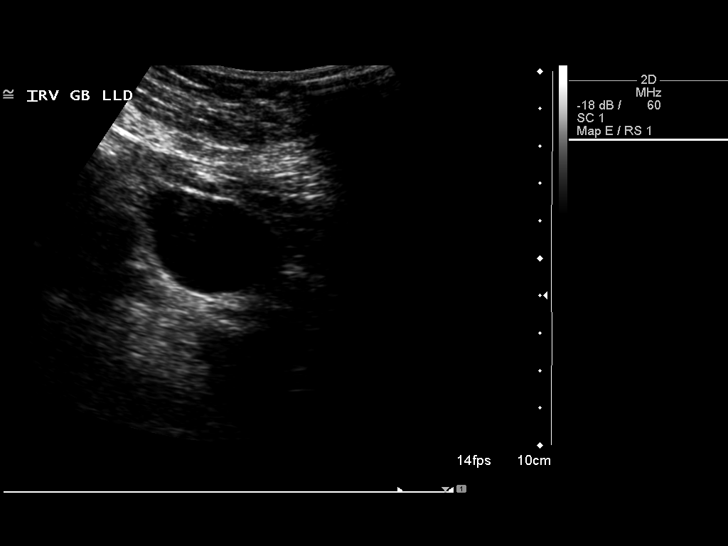
[im 73/88]
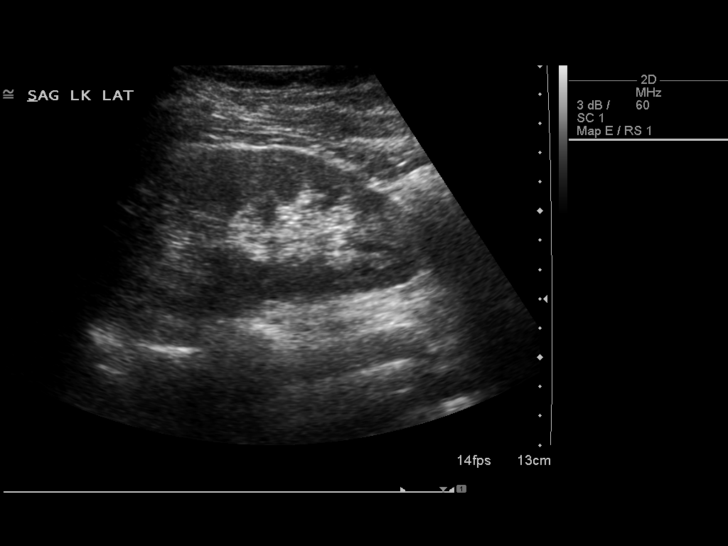
[im 80/88]
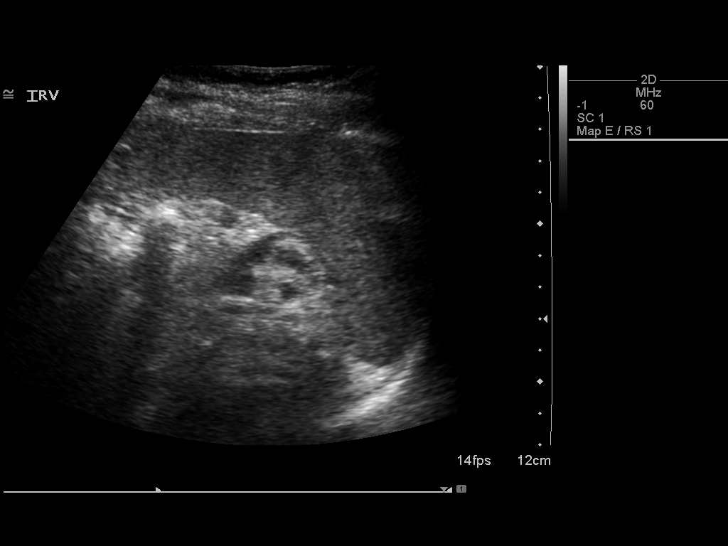
[im 88/88]
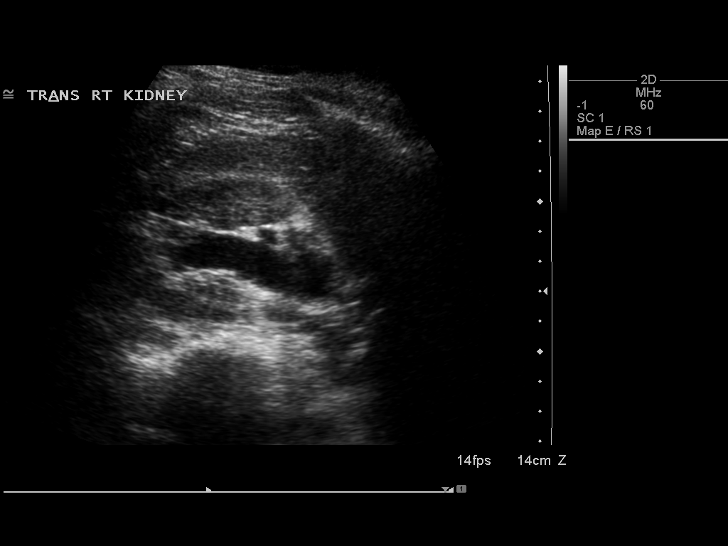

[14 of 25 positions shown; findings below may reference images not displayed]

FINDINGS: Gallbladder: No gallstones or wall thickening visualized. No
sonographic Murphy sign noted by sonographer.

Common bile duct: Diameter: 3.4 mm

Liver: No focal lesion identified. Within normal limits in
parenchymal echogenicity.

IVC: No abnormality visualized.

Pancreas: Visualized portion unremarkable.

Spleen: Size and appearance within normal limits.

Right Kidney: Length: 12.8 cm. Echogenicity within normal limits. No
mass. Mild right hydronephrosis. This persisted after voiding.

Left Kidney: Length: 11.9 cm. Echogenicity within normal limits. No
mass or hydronephrosis visualized.

Abdominal aorta: No aneurysm visualized.

Other findings: None.
IMPRESSION: 1. Mild right hydronephrosis. Hydronephrosis persisted after
voiding.

2. Exam otherwise unremarkable. No gallstones or biliary distention.

## 2017-07-16 DIAGNOSIS — J019 Acute sinusitis, unspecified: Secondary | ICD-10-CM | POA: Diagnosis not present

## 2017-07-16 DIAGNOSIS — B9689 Other specified bacterial agents as the cause of diseases classified elsewhere: Secondary | ICD-10-CM | POA: Diagnosis not present

## 2017-07-29 DIAGNOSIS — B9689 Other specified bacterial agents as the cause of diseases classified elsewhere: Secondary | ICD-10-CM | POA: Diagnosis not present

## 2017-07-29 DIAGNOSIS — J019 Acute sinusitis, unspecified: Secondary | ICD-10-CM | POA: Diagnosis not present

## 2017-08-01 DIAGNOSIS — R413 Other amnesia: Secondary | ICD-10-CM | POA: Diagnosis not present

## 2017-08-01 DIAGNOSIS — L74510 Primary focal hyperhidrosis, axilla: Secondary | ICD-10-CM | POA: Diagnosis not present

## 2017-08-01 DIAGNOSIS — F411 Generalized anxiety disorder: Secondary | ICD-10-CM | POA: Diagnosis not present

## 2017-08-01 DIAGNOSIS — Z23 Encounter for immunization: Secondary | ICD-10-CM | POA: Diagnosis not present

## 2017-08-04 DIAGNOSIS — F4323 Adjustment disorder with mixed anxiety and depressed mood: Secondary | ICD-10-CM | POA: Diagnosis not present

## 2017-08-31 DIAGNOSIS — F411 Generalized anxiety disorder: Secondary | ICD-10-CM | POA: Diagnosis not present

## 2017-08-31 DIAGNOSIS — F330 Major depressive disorder, recurrent, mild: Secondary | ICD-10-CM | POA: Diagnosis not present

## 2017-08-31 DIAGNOSIS — F900 Attention-deficit hyperactivity disorder, predominantly inattentive type: Secondary | ICD-10-CM | POA: Diagnosis not present

## 2017-09-27 ENCOUNTER — Ambulatory Visit: Payer: BLUE CROSS/BLUE SHIELD | Admitting: Neurology

## 2017-10-02 ENCOUNTER — Emergency Department (HOSPITAL_COMMUNITY): Payer: BLUE CROSS/BLUE SHIELD

## 2017-10-02 ENCOUNTER — Emergency Department (HOSPITAL_COMMUNITY)
Admission: EM | Admit: 2017-10-02 | Discharge: 2017-10-02 | Disposition: A | Discharge status: Home / Self Care | Payer: BLUE CROSS/BLUE SHIELD | Attending: Emergency Medicine | Admitting: Emergency Medicine

## 2017-10-02 ENCOUNTER — Other Ambulatory Visit: Payer: Self-pay

## 2017-10-02 ENCOUNTER — Encounter (HOSPITAL_COMMUNITY): Payer: Self-pay

## 2017-10-02 DIAGNOSIS — Z79899 Other long term (current) drug therapy: Secondary | ICD-10-CM | POA: Insufficient documentation

## 2017-10-02 DIAGNOSIS — R05 Cough: Secondary | ICD-10-CM | POA: Diagnosis not present

## 2017-10-02 DIAGNOSIS — R079 Chest pain, unspecified: Secondary | ICD-10-CM | POA: Diagnosis not present

## 2017-10-02 DIAGNOSIS — R0602 Shortness of breath: Secondary | ICD-10-CM | POA: Diagnosis not present

## 2017-10-02 DIAGNOSIS — F419 Anxiety disorder, unspecified: Secondary | ICD-10-CM | POA: Diagnosis not present

## 2017-10-02 DIAGNOSIS — R000 Tachycardia, unspecified: Secondary | ICD-10-CM | POA: Diagnosis not present

## 2017-10-02 DIAGNOSIS — R0600 Dyspnea, unspecified: Secondary | ICD-10-CM | POA: Diagnosis not present

## 2017-10-02 DIAGNOSIS — R42 Dizziness and giddiness: Secondary | ICD-10-CM | POA: Diagnosis not present

## 2017-10-02 HISTORY — DX: Anxiety disorder, unspecified: F41.9

## 2017-10-02 LAB — CBC WITH DIFFERENTIAL/PLATELET
Basophils Absolute: 0 10*3/uL (ref 0.0–0.1)
Basophils Relative: 0 %
Eosinophils Absolute: 0.1 10*3/uL (ref 0.0–0.7)
Eosinophils Relative: 1 %
HCT: 43.5 % (ref 36.0–46.0)
Hemoglobin: 14.6 g/dL (ref 12.0–15.0)
Lymphocytes Relative: 18 %
Lymphs Abs: 1.9 10*3/uL (ref 0.7–4.0)
MCH: 30.7 pg (ref 26.0–34.0)
MCHC: 33.6 g/dL (ref 30.0–36.0)
MCV: 91.4 fL (ref 78.0–100.0)
Monocytes Absolute: 0.8 10*3/uL (ref 0.1–1.0)
Monocytes Relative: 8 %
Neutro Abs: 7.9 10*3/uL — ABNORMAL HIGH (ref 1.7–7.7)
Neutrophils Relative %: 73 %
Platelets: 275 10*3/uL (ref 150–400)
RBC: 4.76 MIL/uL (ref 3.87–5.11)
RDW: 11.9 % (ref 11.5–15.5)
WBC: 10.7 10*3/uL — ABNORMAL HIGH (ref 4.0–10.5)

## 2017-10-02 LAB — BASIC METABOLIC PANEL
Anion gap: 13 (ref 5–15)
BUN: 10 mg/dL (ref 6–20)
CO2: 21 mmol/L — ABNORMAL LOW (ref 22–32)
Calcium: 9.5 mg/dL (ref 8.9–10.3)
Chloride: 105 mmol/L (ref 101–111)
Creatinine, Ser: 0.77 mg/dL (ref 0.44–1.00)
GFR calc Af Amer: >60 mL/min (ref >60)
GFR calc non Af Amer: >60 mL/min (ref >60)
Glucose, Bld: 97 mg/dL (ref 65–99)
Potassium: 3.4 mmol/L — ABNORMAL LOW (ref 3.5–5.1)
Sodium: 139 mmol/L (ref 135–145)

## 2017-10-02 LAB — I-STAT TROPONIN, ED: Troponin i, poc: 0 ng/mL (ref 0.00–0.08)

## 2017-10-02 LAB — D-DIMER, QUANTITATIVE: D-Dimer, Quant: 0.63 ug/mL-FEU — ABNORMAL HIGH (ref 0.00–0.50)

## 2017-10-02 MED ORDER — IOPAMIDOL (ISOVUE-370) INJECTION 76%
INTRAVENOUS | Status: AC
  Filled 2017-10-02: qty 100

## 2017-10-02 MED ORDER — IOPAMIDOL (ISOVUE-370) INJECTION 76%
100.0000 mL | Freq: Once | INTRAVENOUS | Status: AC | PRN
  Administered 2017-10-02: 100 mL via INTRAVENOUS

## 2017-10-02 MED ORDER — LORAZEPAM 1 MG PO TABS: 1.0000 mg | ORAL_TABLET | Freq: Three times a day (TID) | ORAL | 0 refills | Status: AC | PRN

## 2017-10-02 MED ORDER — LORAZEPAM 1 MG PO TABS
1.0000 mg | ORAL_TABLET | Freq: Once | ORAL | Status: AC
  Administered 2017-10-02: 1 mg via ORAL
  Filled 2017-10-02: qty 1

## 2017-10-02 NOTE — ED Notes (Signed)
ED Provider at bedside. 

## 2017-10-02 NOTE — Discharge Instructions (Addendum)
Please read attached information. If you experience any new or worsening signs or symptoms please return to the emergency room for evaluation. Please follow-up with your primary care provider or specialist as discussed. Please use medication prescribed only as directed and discontinue taking if you have any concerning signs or symptoms.   °

## 2017-10-02 NOTE — ED Provider Notes (Signed)
Patient placed in Quick Look pathway, seen and evaluated   Chief Complaint: shortness of breath  HPI:   Linda [name] is a 42 y.o. female who presents to the ED with shortness of breath that started today at noon and has persisted. Patient reports that she felt tingling in all extremities, dizziness and lightheaded. Patient take medication for anxiety and is taking as directed. Patient called her husband at work to bring her to the ED.   ROS: Resp: shortness of breath  Neuro dizziness  Physical Exam:  BP 130/88   Pulse (!) 101   Temp 98 F (36.7 C) (Oral)   Ht 5\' 5"  (1.651 m)   Wt 61.2 kg (135 lb)   LMP 09/24/2017   SpO2 100%   BMI 22.47 kg/m    Gen: No distress  Neuro: Awake and Alert  Skin: Warm and dry  Lungs: clear, patient hyperventilating   Heart: tachycardic    Focused Exam:    Initiation of care has begun. The patient has been counseled on the process, plan, and necessity for staying for the completion/evaluation, and the remainder of the medical screening examination    Ashley Murrain, NP 10/02/17 1521    Carmin Muskrat, MD 10/02/17 [phone]

## 2017-10-02 NOTE — ED Notes (Signed)
Pt to X Ray.

## 2017-10-02 NOTE — ED Provider Notes (Signed)
[facility] EMERGENCY DEPARTMENT Provider Note   CSN: [id number] Arrival date & time: 10/02/17  1452   History   Chief Complaint Chief Complaint  Patient presents with  . Shortness of Breath    HPI Linda [name] is a 42 y.o. female.  HPI    42 year old female presents today with complaints of anxiety and shortness of breath.  Patient notes around 12:00 today she felt tingling in all extremities, dizziness, lightheaded, rapid respirations with shortness of breath.  Patient notes that this felt similar to previous episodes of panic attack but worse.  She did not take any medication for this.  She denied any chest pain, reports she had some right-sided chest tightness while here in the ED.  She denies any fever preceding cough or shortness of breath.  She denies any history of DVT or PE, denies any lower extremity swelling or edema or any other significant risk factors or findings.  Patient denies any abdominal pain nausea vomiting.  Patient denies any personal cardiac history.  She denies any drug use or excessive caffeine intake.    Past Medical History:  Diagnosis Date  . ADHD   . Anxiety   . Bipolar disorder ([place]) 2008  . History of abnormal Pap smear 1/01   mild dysplasia, HPV  . Infertility     Patient Active Problem List   Diagnosis Date Noted  . Cesarean delivery delivered 02/04/2016  . Postpartum care following cesarean delivery (8/4) 02/04/2016  . Preeclampsia 02/04/2016  . Preeclampsia in postpartum period 02/04/2016    Past Surgical History:  Procedure Laterality Date  . AUGMENTATION MAMMAPLASTY Bilateral 6/07   implants  . CESAREAN SECTION N/A 02/04/2016   Procedure: CESAREAN SECTION;  Surgeon: Servando Salina, MD;  Location: [facility];  Service: Obstetrics;  Laterality: N/A;  . IVF Transfer 06/07/2015 N/A 06/07/2015  . THROAT SURGERY  1998   throat absess  . TONSILLECTOMY  1998     OB History    Gravida  2   Para  1   Term    1   Preterm      AB  1   Living  1     SAB  1   TAB      Ectopic      Multiple  0   Live Births  1            Home Medications    Prior to Admission medications   Medication Sig Start Date End Date Taking? Authorizing Provider  acetaminophen (TYLENOL) 325 MG tablet Take 325-650 mg by mouth every 6 (six) hours as needed (for headaches).    Yes [provider]  amphetamine-dextroamphetamine (ADDERALL) 10 MG tablet Take 10-20 mg by mouth See admin instructions. Take 10 by mouth once a day and may take an additional 10 mg if needed to improve focusing skills 08/31/17  Yes [provider]  Black Elderberry (SAMBUCUS ELDERBERRY PO) Take 2 tablets by mouth daily. CHEW   Yes [provider]  BuPROPion HBr (APLENZIN) 174 MG TB24 Take 174 mg by mouth daily. 05/07/17  Yes [provider]  DRYSOL 20 % external solution Apply 1 application topically daily as needed (for sweating).  08/01/17  Yes [provider]  methylphenidate 54 MG PO CR tablet Take 54 mg by mouth every morning. 08/13/17  Yes [provider]  ondansetron (ZOFRAN-ODT) 8 MG disintegrating tablet Take 8 mg by mouth every 8 (eight) hours as needed for nausea  or vomiting.  07/16/17  Yes [provider]  sertraline (ZOLOFT) 100 MG tablet Take 150 mg by mouth daily.    Yes [provider]  traZODone (DESYREL) 50 MG tablet Take 50 mg by mouth at bedtime. 08/31/17  Yes [provider]  zolpidem (AMBIEN) 10 MG tablet Take 10 mg by mouth at bedtime. 09/12/17  Yes [provider]  hydrOXYzine (ATARAX/VISTARIL) 25 MG tablet Take 1 tablet (25 mg total) by mouth every 6 (six) hours as needed for anxiety (mild anxiety). Patient not taking: Reported on 10/02/2017 02/02/17   Duffy Bruce, MD  ibuprofen (ADVIL,MOTRIN) 600 MG tablet Take 1 tablet (600 mg total) by mouth every 6 (six) hours as needed for mild pain. Patient not taking: Reported on 10/02/2017  02/07/16   Artelia Laroche, CNM  LORazepam (ATIVAN) 1 MG tablet Take 1 tablet (1 mg total) by mouth every 8 (eight) hours as needed for anxiety. 10/02/17   Leila Schuff, Dellis Filbert, PA-C  oxyCODONE (OXY IR/ROXICODONE) 5 MG immediate release tablet Take 1 tablet (5 mg total) by mouth every 4 (four) hours as needed for severe pain. Patient not taking: Reported on 10/02/2017 08/09/16   Julianne Rice, MD    Family History Family History  Problem Relation Age of Onset  . Hyperlipidemia Mother   . COPD Mother   . Esophageal cancer Maternal Aunt 52       esophageal, smoker  . Diabetes Maternal Grandfather   . Cancer Paternal Grandmother        melanoma  . Dementia Paternal Grandmother   . Lung cancer Paternal Grandfather        lung    Social History Social History   Tobacco Use  . Smoking status: Never Smoker  . Smokeless tobacco: Never Used  Substance Use Topics  . Alcohol use: Yes    Comment: social  . Drug use: No     Allergies   Penicillins and Phenergan [promethazine hcl]   Review of Systems Review of Systems  All other systems reviewed and are negative.  Physical Exam Updated Vital Signs BP 131/83   Pulse 69   Temp 98 F (36.7 C) (Oral)   Resp 15   Ht 5\' 5"  (1.651 m)   Wt 61.2 kg (135 lb)   LMP 09/24/2017   SpO2 100%   BMI 22.47 kg/m   Physical Exam  Constitutional: She is oriented to person, place, and time. She appears well-developed and well-nourished.  HENT:  Head: Normocephalic and atraumatic.  Eyes: Pupils are equal, round, and reactive to light. Conjunctivae are normal. Right eye exhibits no discharge. Left eye exhibits no discharge. No scleral icterus.  Neck: Normal range of motion. No JVD present. No tracheal deviation present.  Cardiovascular: Normal rate, regular rhythm, normal heart sounds and intact distal pulses. Exam reveals no gallop and no friction rub.  No murmur heard. Pulmonary/Chest: Effort normal and breath sounds normal. No stridor. No  respiratory distress. She has no wheezes. She has no rales. She exhibits no tenderness.  Musculoskeletal: She exhibits no edema.  Neurological: She is alert and oriented to person, place, and time. Coordination normal.  Psychiatric: She has a normal mood and affect. Her behavior is normal. Judgment and thought content normal.  Nursing note and vitals reviewed.   ED Treatments / Results  Labs (all labs ordered are listed, but only abnormal results are displayed) Labs Reviewed  CBC WITH DIFFERENTIAL/PLATELET - Abnormal; Notable for the following components:      Result Value  WBC 10.7 (*)    Neutro Abs 7.9 (*)    All other components within normal limits  BASIC METABOLIC PANEL - Abnormal; Notable for the following components:   Potassium 3.4 (*)    CO2 21 (*)    All other components within normal limits  D-DIMER, QUANTITATIVE (NOT AT [facility]) - Abnormal; Notable for the following components:   D-Dimer, Quant 0.63 (*)    All other components within normal limits  I-STAT TROPONIN, ED    EKG None  Radiology Dg Chest 2 View  Result Date: 10/02/2017 CLINICAL DATA:  Shortness of breath and chest pain. Cough for 4 days. EXAM: CHEST - 2 VIEW COMPARISON:  None. FINDINGS: Lungs are clear. Heart size and pulmonary vascularity are normal. No adenopathy. No pneumothorax. No bone lesions. IMPRESSION: No edema or consolidation. Electronically Signed   By: Lowella Grip III M.D.   On: 10/02/2017 16:30   Ct Angio Chest Pe W And/or Wo Contrast  Result Date: 10/02/2017 CLINICAL DATA:  Dyspnea. Bilateral lower and upper extremity numbness and tingling. EXAM: CT ANGIOGRAPHY CHEST WITH CONTRAST TECHNIQUE: Multidetector CT imaging of the chest was performed using the standard protocol during bolus administration of intravenous contrast. Multiplanar CT image reconstructions and MIPs were obtained to evaluate the vascular anatomy. CONTRAST:  167mL ISOVUE-370 IOPAMIDOL (ISOVUE-370) INJECTION 76% COMPARISON:   Chest radiograph from earlier today. FINDINGS: Cardiovascular: The study is high quality for the evaluation of pulmonary embolism. There are no filling defects in the central, lobar, segmental or subsegmental pulmonary artery branches to suggest acute pulmonary embolism. Great vessels are normal in course and caliber. Normal heart size. No significant pericardial fluid/thickening. Mediastinum/Nodes: No discrete thyroid nodules. Unremarkable esophagus. No pathologically enlarged axillary, mediastinal or hilar lymph nodes. Lungs/Pleura: No pneumothorax. No pleural effusion. No acute consolidative airspace disease, lung masses or significant pulmonary nodules. Two tiny faintly calcified 2 mm granulomas are present in the right lung. Upper abdomen: No acute abnormality. Musculoskeletal: No aggressive appearing focal osseous lesions. Intact appearing partially visualized bilateral breast prostheses. Review of the MIP images confirms the above findings. IMPRESSION: No pulmonary embolism.  No active pulmonary disease. Electronically Signed   By: Ilona Sorrel M.D.   On: 10/02/2017 19:57    Procedures Procedures (including critical care time)  Medications Ordered in ED Medications  iopamidol (ISOVUE-370) 76 % injection (has no administration in time range)  LORazepam (ATIVAN) tablet 1 mg (1 mg Oral Given 10/02/17 1559)  iopamidol (ISOVUE-370) 76 % injection 100 mL (100 mLs Intravenous Contrast Given 10/02/17 1928)     Initial Impression / Assessment and Plan / ED Course  I have reviewed the triage vital signs and the nursing notes.  Pertinent labs & imaging results that were available during my care of the patient were reviewed by me and considered in my medical decision making (see chart for details).     Final Clinical Impressions(s) / ED Diagnoses   Final diagnoses:  Anxiety    Labs: I stat, DG Chest, CBC, BMP, d-dimer  Imaging: ED EKG - no acute abnormalities   Consults:   Therapeutics:  Ativan  Discharge Meds: Ativan  Assessment/Plan: 42 year old female presents today with likely panic attack.  Patient has a history of the same.  She had numbness and tingling in her fingers, upon initial evaluation with previous provider she was laying in the bed and did not want to move.  She notes that she felt like she could not move, throughout my evaluation she had gradual improvement  in her symptoms, ambulates up and down the hall without significant difficulty, no neurological deficits.  Patient does not have chest pain but reporting shortness of breath.  Her oxygen saturation is 100% she is not tachycardic or tachypneic she has no signs of infectious etiology.  Given her complaints of shortness of breath d-dimer was ordered which was elevated.  Patient had a CT angios with no acute abnormalities on this.  I have higher suspicion for anxiety less likely ACS, cardiac arrhythmia, thyroid or metabolic abnormality.  Patient reports she is following up on Thursday in 2 days with neurology as she has had some memory issues.  I encouraged her to continue this follow-up, she is instructed to return immediately to the emergency room if she develops any new or worsening signs or symptoms.  She is accompanied by her best friend will be driving her home and she was given Ativan here.  She will be given a short prescription for Ativan, she is given strict return precautions, she verbalized understanding and agreement to today's plan had no further questions or concerns at time of discharge.    ED Discharge Orders        Ordered    LORazepam (ATIVAN) 1 MG tablet  Every 8 hours PRN     10/02/17 2015       Okey Regal, PA-C 10/02/17 2016    Milton Ferguson, MD 10/03/17 1009

## 2017-10-02 NOTE — ED Notes (Signed)
Pt ambulatory to restroom with steady gait, pt reports she feels "somewhat better" but is still experiencing episodes of tingling in her fingers.

## 2017-10-02 NOTE — ED Notes (Signed)
Pt in CT at this time.

## 2017-10-02 NOTE — ED Triage Notes (Signed)
Pt reports she has been having SOB since noon today with tingling in all extremities, dizziness, and lightheadedness. Pt reports she is on anxiety medication and takes it daily at the same time.

## 2017-10-03 DIAGNOSIS — F411 Generalized anxiety disorder: Secondary | ICD-10-CM | POA: Diagnosis not present

## 2017-10-03 DIAGNOSIS — R6889 Other general symptoms and signs: Secondary | ICD-10-CM | POA: Diagnosis not present

## 2017-10-04 ENCOUNTER — Other Ambulatory Visit: Payer: Self-pay

## 2017-10-04 ENCOUNTER — Encounter: Payer: Self-pay | Admitting: Neurology

## 2017-10-04 ENCOUNTER — Ambulatory Visit (INDEPENDENT_AMBULATORY_CARE_PROVIDER_SITE_OTHER): Payer: BLUE CROSS/BLUE SHIELD | Admitting: Neurology

## 2017-10-04 ENCOUNTER — Telehealth: Payer: Self-pay | Admitting: Neurology

## 2017-10-04 VITALS — BP 121/81 | HR 88 | Resp 14 | Ht 65.0 in | Wt 135.0 lb

## 2017-10-04 DIAGNOSIS — R292 Abnormal reflex: Secondary | ICD-10-CM

## 2017-10-04 DIAGNOSIS — G4700 Insomnia, unspecified: Secondary | ICD-10-CM

## 2017-10-04 DIAGNOSIS — R413 Other amnesia: Secondary | ICD-10-CM

## 2017-10-04 DIAGNOSIS — M542 Cervicalgia: Secondary | ICD-10-CM

## 2017-10-04 DIAGNOSIS — R29898 Other symptoms and signs involving the musculoskeletal system: Secondary | ICD-10-CM | POA: Diagnosis not present

## 2017-10-04 DIAGNOSIS — F418 Other specified anxiety disorders: Secondary | ICD-10-CM | POA: Insufficient documentation

## 2017-10-04 DIAGNOSIS — G959 Disease of spinal cord, unspecified: Secondary | ICD-10-CM | POA: Diagnosis not present

## 2017-10-04 NOTE — Telephone Encounter (Signed)
MR Brain w/wo contrast & MR Cervical spine wo contrast Dr. Cheree Ditto Auth: [place] Ref # [id number]. Pt is scheduled for 10/10/17 at [facility] mobile unit.

## 2017-10-04 NOTE — Progress Notes (Signed)
[facility]  PATIENT: Linda [name] DOB: 1975/10/04  REFERRING DOCTOR OR PCP:  Starlyn Skeans, PA-C (PCP) and  Patriciaann Clan ?? (Psych) SOURCE: Patient, notes from Linda [name], laboratory results, recent emergency room notes  _________________________________   HISTORICAL  CHIEF COMPLAINT:  Chief Complaint  Patient presents with  . Memory Loss    "I just feel like something's not right.  I just can't remember things, I'm easily confused, and there's a history of dementia in  my family."/fim  . Depression/Anxiety    HISTORY OF PRESENT ILLNESS:  I had the pleasure of seeing your patient, Linda [name], and [facility] for neurologic consultation regarding her difficulties with memory and mood.     She notes that she has had struggles with memory her whole life and that school did not come easy for her.    Gradually, she is noting more issues with her memory.  She also feels confused at times.   As examples, at work, she feels she needs to write everything down of she will forget.    She has difficulty with focus and attention.   Sometimes she feels she has trouble getting words out or will mispronounce some words.    She does not note much benefit from the Adderall.   She has always been more visual than verbal.   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    Earlier this week, she couldn't move her arms or legs and felt breathing was difficult while at work .  She went to the ED. She initially worsened and then improved.  She was ruled out for pulmonary embolus.  Lab work was okay though they d-dimer was slightly elevated.   She was told she likely had a panic attack.    She notes sometimes her legs feel weak when she stands, and this has been occurring more and more the past year.   She also reports some neck pain  She goes to [facility] (Dr. Gilberto Better) and therapy at [place] Linda [name]).  She is on Wellbutrin 914 mg, Concerta 54  mg, Adderall 10 mg in the am and 10 mg some afternoon.   She is also on Zoloft 100 mg, Ambien 10 mg nightly and trazodone 50 mg nightly.     With Ambien when fell asleep well but woke up some.   This improved with trazodone.    She feels tired all the time.    She does not snore.    She does not note nocturia.     When she was 3, she was told she had seizures.  Her paternal grandmother had dementia.  Her father had depression and alcoholism.        Montreal Cognitive Assessment  10/04/2017  Visuospatial/ Executive (0/5) 5  Naming (0/3) 3  Attention: Read list of digits (0/2) 2  Attention: Read list of letters (0/1) 1  Attention: Serial 7 subtraction starting at 100 (0/3) 0  Language: Repeat phrase (0/2) 2  Language : Fluency (0/1) 1  Abstraction (0/2) 2  Delayed Recall (0/5) 1  Orientation (0/6) 5  Total 22     REVIEW OF SYSTEMS: Constitutional: No fevers, chills, sweats, or change in appetite Eyes: No visual changes, double vision, eye pain Ear, nose and throat: No hearing loss, ear pain, nasal congestion, sore throat Cardiovascular: No chest pain, palpitations Respiratory: No shortness of breath at rest or with exertion.   No wheezes GastrointestinaI: No nausea, vomiting, diarrhea, abdominal  pain, fecal incontinence Genitourinary: No dysuria, urinary retention or frequency.  No nocturia. Musculoskeletal: No neck pain, back pain Integumentary: No rash, pruritus, skin lesions Neurological: as above Psychiatric: No depression at this time.  No anxiety Endocrine: No palpitations, diaphoresis, change in appetite, change in weigh or increased thirst Hematologic/Lymphatic: No anemia, purpura, petechiae. Allergic/Immunologic: No itchy/runny eyes, nasal congestion, recent allergic reactions, rashes  ALLERGIES: Allergies  Allergen Reactions  . Penicillins Hives    Has patient had a PCN reaction causing immediate rash, facial/tongue/throat swelling, SOB or lightheadedness with  hypotension: Yes Has patient had a PCN reaction causing severe rash involving mucus membranes or skin necrosis: No Has patient had a PCN reaction that required hospitalization: No Has patient had a PCN reaction occurring within the last 10 years: No If all of the above answers are "NO", then may proceed with Cephalosporin use.   Marland Kitchen Phenergan [Promethazine Hcl] Other (See Comments)    From childhood; reaction (??)    HOME MEDICATIONS:  Current Outpatient Medications:  .  acetaminophen (TYLENOL) 325 MG tablet, Take 325-650 mg by mouth every 6 (six) hours as needed (for headaches). , Disp: , Rfl:  .  amphetamine-dextroamphetamine (ADDERALL) 10 MG tablet, Take 10-20 mg by mouth See admin instructions. Take 10 by mouth once a day and may take an additional 10 mg if needed to improve focusing skills, Disp: , Rfl: 0 .  Black Elderberry (SAMBUCUS ELDERBERRY PO), Take 2 tablets by mouth daily. CHEW, Disp: , Rfl:  .  BuPROPion HBr (APLENZIN) 174 MG TB24, Take 174 mg by mouth daily., Disp: , Rfl:  .  DRYSOL 20 % external solution, Apply 1 application topically daily as needed (for sweating). , Disp: , Rfl: 2 .  ibuprofen (ADVIL,MOTRIN) 600 MG tablet, Take 1 tablet (600 mg total) by mouth every 6 (six) hours as needed for mild pain., Disp: 30 tablet, Rfl: 0 .  LORazepam (ATIVAN) 1 MG tablet, Take 1 tablet (1 mg total) by mouth every 8 (eight) hours as needed for anxiety., Disp: 6 tablet, Rfl: 0 .  methylphenidate 54 MG PO CR tablet, Take 54 mg by mouth every morning., Disp: , Rfl: 0 .  ondansetron (ZOFRAN-ODT) 8 MG disintegrating tablet, Take 8 mg by mouth every 8 (eight) hours as needed for nausea or vomiting. , Disp: , Rfl: 0 .  sertraline (ZOLOFT) 100 MG tablet, Take 150 mg by mouth daily. , Disp: , Rfl:  .  traZODone (DESYREL) 50 MG tablet, Take 50 mg by mouth at bedtime., Disp: , Rfl: 1 .  zolpidem (AMBIEN) 10 MG tablet, Take 10 mg by mouth at bedtime., Disp: , Rfl: 5 .  hydrOXYzine  (ATARAX/VISTARIL) 25 MG tablet, Take 1 tablet (25 mg total) by mouth every 6 (six) hours as needed for anxiety (mild anxiety). (Patient not taking: Reported on 10/02/2017), Disp: 20 tablet, Rfl: 0 .  oxyCODONE (OXY IR/ROXICODONE) 5 MG immediate release tablet, Take 1 tablet (5 mg total) by mouth every 4 (four) hours as needed for severe pain. (Patient not taking: Reported on 10/02/2017), Disp: 6 tablet, Rfl: 0  PAST MEDICAL HISTORY: Past Medical History:  Diagnosis Date  . ADHD   . Anxiety   . Bipolar disorder ([place]) 2008  . History of abnormal Pap smear 1/01   mild dysplasia, HPV  . Infertility     PAST SURGICAL HISTORY: Past Surgical History:  Procedure Laterality Date  . AUGMENTATION MAMMAPLASTY Bilateral 6/07   implants  . CESAREAN SECTION N/A 02/04/2016  Procedure: CESAREAN SECTION;  Surgeon: Servando Salina, MD;  Location: [facility];  Service: Obstetrics;  Laterality: N/A;  . IVF Transfer 06/07/2015 N/A 06/07/2015  . THROAT SURGERY  1998   throat absess  . TONSILLECTOMY  1998    FAMILY HISTORY: Family History  Problem Relation Age of Onset  . Hyperlipidemia Mother   . COPD Mother   . Esophageal cancer Maternal Aunt 52       esophageal, smoker  . Diabetes Maternal Grandfather   . Cancer Paternal Grandmother        melanoma  . Dementia Paternal Grandmother   . Lung cancer Paternal Grandfather        lung    SOCIAL HISTORY:  Social History   Socioeconomic History  . Marital status: Married    Spouse name: Not on file  . Number of children: 0  . Years of education: Not on file  . Highest education level: Not on file  Occupational History  . Not on file  Social Needs  . Financial resource strain: Not on file  . Food insecurity:    Worry: Not on file    Inability: Not on file  . Transportation needs:    Medical: Not on file    Non-medical: Not on file  Tobacco Use  . Smoking status: Never Smoker  . Smokeless tobacco: Never Used  Substance and  Sexual Activity  . Alcohol use: Yes    Comment: social  . Drug use: No  . Sexual activity: Yes    Partners: Male    Birth control/protection: None  Lifestyle  . Physical activity:    Days per week: Not on file    Minutes per session: Not on file  . Stress: Not on file  Relationships  . Social connections:    Talks on phone: Not on file    Gets together: Not on file    Attends religious service: Not on file    Active member of club or organization: Not on file    Attends meetings of clubs or organizations: Not on file    Relationship status: Not on file  . Intimate partner violence:    Fear of current or ex partner: Not on file    Emotionally abused: Not on file    Physically abused: Not on file    Forced sexual activity: Not on file  Other Topics Concern  . Not on file  Social History Narrative  . Not on file     PHYSICAL EXAM  Vitals:   10/04/17 1042  BP: 121/81  Pulse: 88  Resp: 14  Weight: 135 lb (61.2 kg)  Height: 5\' 5"  (1.651 m)    Body mass index is 22.47 kg/m.   General: The patient is well-developed and well-nourished and in no acute distress  Eyes:  Funduscopic exam shows normal optic discs and retinal vessels.  Neck: The neck is supple, no carotid bruits are noted.  The neck is nontender.  Cardiovascular: The heart has a regular rate and rhythm with a normal S1 and S2. There were no murmurs, gallops or rubs. Lungs are clear to auscultation.  Skin: Extremities are without significant edema.  Musculoskeletal:  Back is nontender  Neurologic Exam  Mental status: The patient is alert and oriented x 2 1/2 (wrong day of month) at the time of the examination. She scored 22/30 on the MoCA, missing 4 points for recall (however got 3 of those 4 right with category prompt) and losing 3  points for serial 7's and one point for day.   On a second test:  100-93-84-75; WORLD-DLROW.     Speech is normal.  Cranial nerves: Extraocular movements are full. Pupils  are equal, round, and reactive to light and accomodation.  Visual fields are full.  Facial symmetry is present. There is good facial sensation to soft touch bilaterally.Facial strength is normal.  Trapezius and sternocleidomastoid strength is normal. No dysarthria is noted.  The tongue is midline, and the patient has symmetric elevation of the soft palate. No obvious hearing deficits are noted.  Motor:  Muscle bulk is normal.   Tone is normal. Strength is  5 / 5 in all 4 extremities.   Sensory: Sensory testing is intact to pinprick, soft touch and vibration sensation in all 4 extremities except for mild reduced vibration at the toes..  Coordination: Cerebellar testing reveals good finger-nose-finger and heel-to-shin bilaterally.  Gait and station: Station is normal.   Gait is normal. Tandem gait is normal. Romberg is negative.   Reflexes: Deep tendon reflexes are increased in the arms and legs.  There is spread at the knees and 2 beats of nonsustained clonus at the ankles..   Plantar responses are flexor.    DIAGNOSTIC DATA (LABS, IMAGING, TESTING) - I reviewed patient records, labs, notes, testing and imaging myself where available.  Lab Results  Component Value Date   WBC 10.7 (H) 10/02/2017   HGB 14.6 10/02/2017   HCT 43.5 10/02/2017   MCV 91.4 10/02/2017   PLT 275 10/02/2017      Component Value Date/Time   NA 139 10/02/2017 1753   K 3.4 (L) 10/02/2017 1753   CL 105 10/02/2017 1753   CO2 21 (L) 10/02/2017 1753   GLUCOSE 97 10/02/2017 1753   BUN 10 10/02/2017 1753   CREATININE 0.77 10/02/2017 1753   CALCIUM 9.5 10/02/2017 1753   PROT 6.4 (L) 08/09/2016 0304   ALBUMIN 4.2 08/09/2016 0304   AST 21 08/09/2016 0304   ALT 21 08/09/2016 0304   ALKPHOS 55 08/09/2016 0304   BILITOT 0.4 08/09/2016 0304   GFRNONAA >60 10/02/2017 1753   GFRAA >60 10/02/2017 1753    Lab Results  Component Value Date   TSH 1.594 02/02/2017       ASSESSMENT AND PLAN  Memory loss - Plan:  Vitamin B12, TSH, VITAMIN D 25 Hydroxy (Vit-D Deficiency, Fractures), MR BRAIN W WO CONTRAST, Comprehensive metabolic panel  Depression with anxiety - Plan: TSH  Insomnia, unspecified type - Plan: TSH  Hyperreflexia - Plan: Vitamin B12  Abnormal reflex - Plan: MR CERVICAL SPINE WO CONTRAST  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   Her performance on the [facility] cognitive assessment is more consistent with a problem with attention and focus rather than storage.  Additionally she did very well with visual spatial tasks.  She was concerned about Alzheimer's and we reviewed that her problem seems to be more with getting memories out rather than storage.  The most common problems causing attentional issues with memory or depression, poor sleep, other medical problems and attention deficit.  She does have depression and attention deficit and they are likely playing some role.  On her examination she has increased reflexes in her  legs and mild numbness in the toes.  We need to check a B12, vitamin D, CMP, TSH to evaluate for treatable causes of memory problems in younger adults.  Additionally because of the memory problems and her hyperreflexia we need to check an MRI of the brain and the spinal cord to rule out demyelination, ischemic change and compressive myelopathy.  The office will call her with the results of the studies.  Based on the results she may need additional evaluation and treatment.  Thank you for asking me to see Linda [name].  Please let me know if I can be of further assistance with her or other patients in the future.  Fitzhugh Vizcarrondo A. Felecia Shelling, MD, [facility] 09/04/5972, 1:63 PM Certified in Neurology, Clinical Neurophysiology, Sleep Medicine, Pain Medicine and  Neuroimaging  [facility] [address], [place],  [address] [phone]

## 2017-10-05 LAB — COMPREHENSIVE METABOLIC PANEL
ALT: 11 IU/L (ref 0–32)
AST: 16 IU/L (ref 0–40)
Albumin/Globulin Ratio: 2.3 — ABNORMAL HIGH (ref 1.2–2.2)
Albumin: 5.4 g/dL (ref 3.5–5.5)
Alkaline Phosphatase: 72 IU/L (ref 39–117)
BUN/Creatinine Ratio: 20 (ref 9–23)
BUN: 17 mg/dL (ref 6–24)
Bilirubin Total: 0.3 mg/dL (ref 0.0–1.2)
CO2: 21 mmol/L (ref 20–29)
Calcium: 9.8 mg/dL (ref 8.7–10.2)
Chloride: 101 mmol/L (ref 96–106)
Creatinine, Ser: 0.87 mg/dL (ref 0.57–1.00)
GFR calc Af Amer: 95 mL/min/{1.73_m2} (ref >59)
GFR calc non Af Amer: 82 mL/min/{1.73_m2} (ref >59)
Globulin, Total: 2.3 g/dL (ref 1.5–4.5)
Glucose: 94 mg/dL (ref 65–99)
Potassium: 4.6 mmol/L (ref 3.5–5.2)
Sodium: 139 mmol/L (ref 134–144)
Total Protein: 7.7 g/dL (ref 6.0–8.5)

## 2017-10-05 LAB — TSH: TSH: 1.68 u[IU]/mL (ref 0.450–4.500)

## 2017-10-05 LAB — VITAMIN D 25 HYDROXY (VIT D DEFICIENCY, FRACTURES): Vit D, 25-Hydroxy: 14.5 ng/mL — ABNORMAL LOW (ref 30.0–100.0)

## 2017-10-05 LAB — VITAMIN B12: Vitamin B-12: 459 pg/mL (ref 232–1245)

## 2017-10-08 ENCOUNTER — Telehealth: Payer: Self-pay | Admitting: Neurology

## 2017-10-08 ENCOUNTER — Telehealth: Payer: Self-pay | Admitting: *Deleted

## 2017-10-08 DIAGNOSIS — F411 Generalized anxiety disorder: Secondary | ICD-10-CM | POA: Diagnosis not present

## 2017-10-08 DIAGNOSIS — F330 Major depressive disorder, recurrent, mild: Secondary | ICD-10-CM | POA: Diagnosis not present

## 2017-10-08 MED ORDER — VITAMIN D (ERGOCALCIFEROL) 1.25 MG (50000 UNIT) PO CAPS: 50000.0000 [IU] | ORAL_CAPSULE | ORAL | 3 refills | Status: AC

## 2017-10-08 NOTE — Telephone Encounter (Signed)
-----   Message from Britt Bottom, MD sent at 10/05/2017  4:57 PM EDT ----- Please let her know that the vitamin D was low and I would like her to take a supplement.  Please call in 50,000 units weekly #[phone]   other labs were fine.

## 2017-10-08 NOTE — Telephone Encounter (Signed)
See result note/fim 

## 2017-10-08 NOTE — Telephone Encounter (Signed)
Pt states since the lab results are available on My chart she is asking for a call from RN with the results being explained to her

## 2017-10-08 NOTE — Telephone Encounter (Signed)
Spoke with Lorenza Chick and reviewed below lab results.  She verbalized understanding of same, is agreeable to taking rx. Vit. D once weekly.  Rx. escribed to [facility] per her request/fim

## 2017-10-09 ENCOUNTER — Ambulatory Visit: Payer: BLUE CROSS/BLUE SHIELD

## 2017-10-09 DIAGNOSIS — R292 Abnormal reflex: Secondary | ICD-10-CM

## 2017-10-09 DIAGNOSIS — R413 Other amnesia: Secondary | ICD-10-CM

## 2017-10-09 DIAGNOSIS — G959 Disease of spinal cord, unspecified: Secondary | ICD-10-CM

## 2017-10-09 MED ORDER — GADOPENTETATE DIMEGLUMINE 469.01 MG/ML IV SOLN
12.0000 mL | Freq: Once | INTRAVENOUS | Status: AC | PRN
  Administered 2017-10-09: 12 mL via INTRAVENOUS

## 2017-10-10 ENCOUNTER — Other Ambulatory Visit: Payer: BLUE CROSS/BLUE SHIELD

## 2017-10-11 ENCOUNTER — Telehealth: Payer: Self-pay | Admitting: *Deleted

## 2017-10-11 NOTE — Telephone Encounter (Signed)
Duplicate/fim 

## 2017-10-11 NOTE — Telephone Encounter (Signed)
-----   Message from Britt Bottom, MD sent at 10/11/2017 11:49 AM EDT ----- Please let her know that the MRI of the brain was normal.  The MRI of the cervical spine showed some degenerative arthritis and disc changes.  It is not bad enough to cause any weakness, if she notes neck pain, she should take OTC NSAIDs

## 2017-10-11 NOTE — Telephone Encounter (Signed)
-----   Message from Britt Bottom, MD sent at 10/11/2017 11:48 AM EDT ----- Please let her know that the MRI of the brain was normal.  The MRI of the cervical spine does show some degenerative arthritis and disc changes but nothing bad enough to cause weakness if she has neck pain she should take OTC NSAIDs.

## 2017-10-11 NOTE — Telephone Encounter (Signed)
Spoke with Linda [name] and reviewed below MRI report, with rec. to take otc NSAID for any neck pain she may have.  She verbalized understanding fo same/fim

## 2017-10-12 DIAGNOSIS — F3342 Major depressive disorder, recurrent, in full remission: Secondary | ICD-10-CM | POA: Diagnosis not present

## 2017-10-12 DIAGNOSIS — F411 Generalized anxiety disorder: Secondary | ICD-10-CM | POA: Diagnosis not present

## 2017-10-18 ENCOUNTER — Ambulatory Visit: Payer: BLUE CROSS/BLUE SHIELD | Admitting: Neurology

## 2017-10-24 DIAGNOSIS — R52 Pain, unspecified: Secondary | ICD-10-CM | POA: Diagnosis not present

## 2017-10-24 DIAGNOSIS — J208 Acute bronchitis due to other specified organisms: Secondary | ICD-10-CM | POA: Diagnosis not present

## 2017-11-12 DIAGNOSIS — F411 Generalized anxiety disorder: Secondary | ICD-10-CM | POA: Diagnosis not present

## 2017-11-12 DIAGNOSIS — F3342 Major depressive disorder, recurrent, in full remission: Secondary | ICD-10-CM | POA: Diagnosis not present

## 2017-11-12 DIAGNOSIS — F900 Attention-deficit hyperactivity disorder, predominantly inattentive type: Secondary | ICD-10-CM | POA: Diagnosis not present

## 2017-12-12 DIAGNOSIS — F3342 Major depressive disorder, recurrent, in full remission: Secondary | ICD-10-CM | POA: Diagnosis not present

## 2017-12-12 DIAGNOSIS — F411 Generalized anxiety disorder: Secondary | ICD-10-CM | POA: Diagnosis not present

## 2017-12-12 DIAGNOSIS — F900 Attention-deficit hyperactivity disorder, predominantly inattentive type: Secondary | ICD-10-CM | POA: Diagnosis not present

## 2017-12-28 DIAGNOSIS — M7582 Other shoulder lesions, left shoulder: Secondary | ICD-10-CM | POA: Diagnosis not present

## 2017-12-28 DIAGNOSIS — M5412 Radiculopathy, cervical region: Secondary | ICD-10-CM | POA: Diagnosis not present

## 2018-01-02 DIAGNOSIS — M542 Cervicalgia: Secondary | ICD-10-CM | POA: Diagnosis not present

## 2018-01-07 DIAGNOSIS — M5000 Cervical disc disorder with myelopathy, unspecified cervical region: Secondary | ICD-10-CM | POA: Diagnosis not present

## 2018-02-05 DIAGNOSIS — Z319 Encounter for procreative management, unspecified: Secondary | ICD-10-CM | POA: Diagnosis not present

## 2018-02-06 DIAGNOSIS — F411 Generalized anxiety disorder: Secondary | ICD-10-CM | POA: Diagnosis not present

## 2018-02-06 DIAGNOSIS — F3342 Major depressive disorder, recurrent, in full remission: Secondary | ICD-10-CM | POA: Diagnosis not present

## 2018-05-10 DIAGNOSIS — F411 Generalized anxiety disorder: Secondary | ICD-10-CM | POA: Diagnosis not present

## 2018-05-10 DIAGNOSIS — F3342 Major depressive disorder, recurrent, in full remission: Secondary | ICD-10-CM | POA: Diagnosis not present

## 2018-05-10 DIAGNOSIS — F900 Attention-deficit hyperactivity disorder, predominantly inattentive type: Secondary | ICD-10-CM | POA: Diagnosis not present

## 2018-05-13 DIAGNOSIS — D225 Melanocytic nevi of trunk: Secondary | ICD-10-CM | POA: Diagnosis not present

## 2018-05-13 DIAGNOSIS — D1801 Hemangioma of skin and subcutaneous tissue: Secondary | ICD-10-CM | POA: Diagnosis not present

## 2018-05-13 DIAGNOSIS — L570 Actinic keratosis: Secondary | ICD-10-CM | POA: Diagnosis not present

## 2018-05-13 DIAGNOSIS — L814 Other melanin hyperpigmentation: Secondary | ICD-10-CM | POA: Diagnosis not present

## 2018-07-04 DIAGNOSIS — J019 Acute sinusitis, unspecified: Secondary | ICD-10-CM | POA: Diagnosis not present

## 2018-07-04 DIAGNOSIS — B9689 Other specified bacterial agents as the cause of diseases classified elsewhere: Secondary | ICD-10-CM | POA: Diagnosis not present

## 2018-08-05 DIAGNOSIS — R05 Cough: Secondary | ICD-10-CM | POA: Diagnosis not present

## 2018-08-05 DIAGNOSIS — R5383 Other fatigue: Secondary | ICD-10-CM | POA: Diagnosis not present

## 2018-08-05 DIAGNOSIS — R42 Dizziness and giddiness: Secondary | ICD-10-CM | POA: Diagnosis not present

## 2018-10-08 DIAGNOSIS — J029 Acute pharyngitis, unspecified: Secondary | ICD-10-CM | POA: Diagnosis not present

## 2018-10-08 DIAGNOSIS — Z20828 Contact with and (suspected) exposure to other viral communicable diseases: Secondary | ICD-10-CM | POA: Diagnosis not present

## 2018-10-09 DIAGNOSIS — Z03818 Encounter for observation for suspected exposure to other biological agents ruled out: Secondary | ICD-10-CM | POA: Diagnosis not present

## 2018-10-09 DIAGNOSIS — R0600 Dyspnea, unspecified: Secondary | ICD-10-CM | POA: Diagnosis not present

## 2018-10-09 DIAGNOSIS — Z888 Allergy status to other drugs, medicaments and biological substances status: Secondary | ICD-10-CM | POA: Diagnosis not present

## 2018-10-09 DIAGNOSIS — R001 Bradycardia, unspecified: Secondary | ICD-10-CM | POA: Diagnosis not present

## 2018-10-09 DIAGNOSIS — R0789 Other chest pain: Secondary | ICD-10-CM | POA: Diagnosis not present

## 2018-10-09 DIAGNOSIS — R0602 Shortness of breath: Secondary | ICD-10-CM | POA: Diagnosis not present

## 2018-10-09 DIAGNOSIS — Z880 Allergy status to penicillin: Secondary | ICD-10-CM | POA: Diagnosis not present

## 2018-10-17 DIAGNOSIS — F900 Attention-deficit hyperactivity disorder, predominantly inattentive type: Secondary | ICD-10-CM | POA: Diagnosis not present

## 2018-10-17 DIAGNOSIS — F411 Generalized anxiety disorder: Secondary | ICD-10-CM | POA: Diagnosis not present

## 2018-10-31 DIAGNOSIS — F900 Attention-deficit hyperactivity disorder, predominantly inattentive type: Secondary | ICD-10-CM | POA: Diagnosis not present

## 2018-10-31 DIAGNOSIS — F411 Generalized anxiety disorder: Secondary | ICD-10-CM | POA: Diagnosis not present

## 2018-12-23 DIAGNOSIS — F411 Generalized anxiety disorder: Secondary | ICD-10-CM | POA: Diagnosis not present

## 2018-12-23 DIAGNOSIS — F900 Attention-deficit hyperactivity disorder, predominantly inattentive type: Secondary | ICD-10-CM | POA: Diagnosis not present

## 2018-12-30 DIAGNOSIS — F411 Generalized anxiety disorder: Secondary | ICD-10-CM | POA: Diagnosis not present

## 2018-12-30 DIAGNOSIS — F900 Attention-deficit hyperactivity disorder, predominantly inattentive type: Secondary | ICD-10-CM | POA: Diagnosis not present

## 2018-12-30 DIAGNOSIS — F331 Major depressive disorder, recurrent, moderate: Secondary | ICD-10-CM | POA: Diagnosis not present

## 2019-01-31 DIAGNOSIS — F339 Major depressive disorder, recurrent, unspecified: Secondary | ICD-10-CM | POA: Diagnosis not present

## 2019-01-31 DIAGNOSIS — F411 Generalized anxiety disorder: Secondary | ICD-10-CM | POA: Diagnosis not present

## 2019-01-31 DIAGNOSIS — F900 Attention-deficit hyperactivity disorder, predominantly inattentive type: Secondary | ICD-10-CM | POA: Diagnosis not present

## 2019-03-03 DIAGNOSIS — Z319 Encounter for procreative management, unspecified: Secondary | ICD-10-CM | POA: Diagnosis not present

## 2019-03-19 DIAGNOSIS — Z0000 Encounter for general adult medical examination without abnormal findings: Secondary | ICD-10-CM | POA: Diagnosis not present

## 2019-03-19 DIAGNOSIS — Z13220 Encounter for screening for lipoid disorders: Secondary | ICD-10-CM | POA: Diagnosis not present

## 2019-03-19 DIAGNOSIS — Z23 Encounter for immunization: Secondary | ICD-10-CM | POA: Diagnosis not present

## 2019-03-19 DIAGNOSIS — R635 Abnormal weight gain: Secondary | ICD-10-CM | POA: Diagnosis not present

## 2019-03-24 DIAGNOSIS — F411 Generalized anxiety disorder: Secondary | ICD-10-CM | POA: Diagnosis not present

## 2019-03-24 DIAGNOSIS — F900 Attention-deficit hyperactivity disorder, predominantly inattentive type: Secondary | ICD-10-CM | POA: Diagnosis not present

## 2019-04-08 DIAGNOSIS — F411 Generalized anxiety disorder: Secondary | ICD-10-CM | POA: Diagnosis not present

## 2019-04-08 DIAGNOSIS — F900 Attention-deficit hyperactivity disorder, predominantly inattentive type: Secondary | ICD-10-CM | POA: Diagnosis not present

## 2019-04-08 DIAGNOSIS — F339 Major depressive disorder, recurrent, unspecified: Secondary | ICD-10-CM | POA: Diagnosis not present

## 2019-06-22 ENCOUNTER — Encounter (HOSPITAL_COMMUNITY): Payer: Self-pay | Admitting: Emergency Medicine

## 2019-06-22 ENCOUNTER — Other Ambulatory Visit: Payer: Self-pay

## 2019-06-22 ENCOUNTER — Emergency Department (HOSPITAL_COMMUNITY)
Admission: EM | Admit: 2019-06-22 | Discharge: 2019-06-22 | Disposition: A | Discharge status: Home / Self Care | Payer: BC Managed Care – PPO | Attending: Emergency Medicine | Admitting: Emergency Medicine

## 2019-06-22 ENCOUNTER — Emergency Department (HOSPITAL_COMMUNITY): Payer: BC Managed Care – PPO

## 2019-06-22 DIAGNOSIS — Z79899 Other long term (current) drug therapy: Secondary | ICD-10-CM | POA: Diagnosis not present

## 2019-06-22 DIAGNOSIS — R42 Dizziness and giddiness: Secondary | ICD-10-CM | POA: Diagnosis not present

## 2019-06-22 DIAGNOSIS — F909 Attention-deficit hyperactivity disorder, unspecified type: Secondary | ICD-10-CM | POA: Diagnosis not present

## 2019-06-22 DIAGNOSIS — R0789 Other chest pain: Secondary | ICD-10-CM | POA: Insufficient documentation

## 2019-06-22 DIAGNOSIS — Z20828 Contact with and (suspected) exposure to other viral communicable diseases: Secondary | ICD-10-CM | POA: Insufficient documentation

## 2019-06-22 DIAGNOSIS — R112 Nausea with vomiting, unspecified: Secondary | ICD-10-CM | POA: Diagnosis not present

## 2019-06-22 DIAGNOSIS — R079 Chest pain, unspecified: Secondary | ICD-10-CM | POA: Diagnosis not present

## 2019-06-22 DIAGNOSIS — R197 Diarrhea, unspecified: Secondary | ICD-10-CM | POA: Insufficient documentation

## 2019-06-22 DIAGNOSIS — R404 Transient alteration of awareness: Secondary | ICD-10-CM | POA: Diagnosis not present

## 2019-06-22 LAB — TROPONIN I (HIGH SENSITIVITY)
Troponin I (High Sensitivity): <2 ng/L (ref <18)
Troponin I (High Sensitivity): <2 ng/L (ref <18)

## 2019-06-22 LAB — CBC WITH DIFFERENTIAL/PLATELET
Abs Immature Granulocytes: 0.05 10*3/uL (ref 0.00–0.07)
Basophils Absolute: 0.1 10*3/uL (ref 0.0–0.1)
Basophils Relative: 0 %
Eosinophils Absolute: 0.1 10*3/uL (ref 0.0–0.5)
Eosinophils Relative: 1 %
HCT: 38.1 % (ref 36.0–46.0)
Hemoglobin: 12.6 g/dL (ref 12.0–15.0)
Immature Granulocytes: 0 %
Lymphocytes Relative: 12 %
Lymphs Abs: 1.4 10*3/uL (ref 0.7–4.0)
MCH: 30.4 pg (ref 26.0–34.0)
MCHC: 33.1 g/dL (ref 30.0–36.0)
MCV: 92 fL (ref 80.0–100.0)
Monocytes Absolute: 0.7 10*3/uL (ref 0.1–1.0)
Monocytes Relative: 6 %
Neutro Abs: 9.1 10*3/uL — ABNORMAL HIGH (ref 1.7–7.7)
Neutrophils Relative %: 81 %
Platelets: 220 10*3/uL (ref 150–400)
RBC: 4.14 MIL/uL (ref 3.87–5.11)
RDW: 12 % (ref 11.5–15.5)
WBC: 11.3 10*3/uL — ABNORMAL HIGH (ref 4.0–10.5)
nRBC: 0 % (ref 0.0–0.2)

## 2019-06-22 LAB — COMPREHENSIVE METABOLIC PANEL
ALT: 10 U/L (ref 0–44)
AST: 14 U/L — ABNORMAL LOW (ref 15–41)
Albumin: 3.8 g/dL (ref 3.5–5.0)
Alkaline Phosphatase: 54 U/L (ref 38–126)
Anion gap: 9 (ref 5–15)
BUN: 15 mg/dL (ref 6–20)
CO2: 24 mmol/L (ref 22–32)
Calcium: 9.2 mg/dL (ref 8.9–10.3)
Chloride: 107 mmol/L (ref 98–111)
Creatinine, Ser: 0.95 mg/dL (ref 0.44–1.00)
GFR calc Af Amer: >60 mL/min (ref >60)
GFR calc non Af Amer: >60 mL/min (ref >60)
Glucose, Bld: 98 mg/dL (ref 70–99)
Potassium: 3.5 mmol/L (ref 3.5–5.1)
Sodium: 140 mmol/L (ref 135–145)
Total Bilirubin: 0.3 mg/dL (ref 0.3–1.2)
Total Protein: 6 g/dL — ABNORMAL LOW (ref 6.5–8.1)

## 2019-06-22 LAB — POC SARS CORONAVIRUS 2 AG -  ED: SARS Coronavirus 2 Ag: NEGATIVE

## 2019-06-22 LAB — LIPASE, BLOOD: Lipase: 44 U/L (ref 11–51)

## 2019-06-22 LAB — D-DIMER, QUANTITATIVE: D-Dimer, Quant: 0.37 ug/mL-FEU (ref 0.00–0.50)

## 2019-06-22 MED ORDER — LORAZEPAM 2 MG/ML IJ SOLN
0.5000 mg | Freq: Once | INTRAMUSCULAR | Status: AC
  Administered 2019-06-22: 0.5 mg via INTRAVENOUS
  Filled 2019-06-22: qty 1

## 2019-06-22 MED ORDER — ALUM & MAG HYDROXIDE-SIMETH 200-200-20 MG/5ML PO SUSP
30.0000 mL | Freq: Once | ORAL | Status: AC
  Administered 2019-06-22: 30 mL via ORAL
  Filled 2019-06-22: qty 30

## 2019-06-22 MED ORDER — LIDOCAINE VISCOUS HCL 2 % MT SOLN
15.0000 mL | Freq: Once | OROMUCOSAL | Status: AC
  Administered 2019-06-22: 15 mL via ORAL
  Filled 2019-06-22: qty 15

## 2019-06-22 NOTE — ED Provider Notes (Signed)
Patient care assumed at 0700. Patient here for evaluation of chest tightness that feels like a pill stuck in her throat although she denies a pillow getting stuck there. She did have an episode of vomiting and diarrhea, now resolved. She denies any current abdominal pain. She does have sensation of something in her chest and feeling unwell. Abdominal exam is soft and nontender. Will provide trial of G.I. cocktail, check D dimer and reassess.  D dimer is not elevated. She was treated with G.I. cocktail, Ativan for some component of possible anxiety. Will send COVID PCR. Troponin is negative times two. Presentation is not consistent with ACS, PE, acute abdomen. Discussed with patient unclear source of symptoms. Discussed outpatient follow-up and return precautions.  Ninna Wilt was evaluated in Emergency Department on 06/22/2019 for the symptoms described in the history of present illness. She was evaluated in the context of the global COVID-19 pandemic, which necessitated consideration that the patient might be at risk for infection with the SARS-CoV-2 virus that causes COVID-19. Institutional protocols and algorithms that pertain to the evaluation of patients at risk for COVID-19 are in a state of rapid change based on information released by regulatory bodies including the CDC and federal and state organizations. These policies and algorithms were followed during the patient's care in the ED.    Quintella Reichert, MD 06/22/19 1451

## 2019-06-22 NOTE — ED Provider Notes (Signed)
[facility] EMERGENCY DEPARTMENT Provider Note   CSN: [id number] Arrival date & time: 06/22/19  0406     History Chief Complaint  Patient presents with  . Chest Pain    Linda [name] is a 43 y.o. female.  Patient presents to the emergency department for evaluation of chest pain.  Patient reports that she was awakened from sleep at 215 with chest pain.  She reports that it felt like indigestion or like "a pill was stuck".  She drank a lot of water to see if it would help but there was no change.  Patient then had onset of nausea, vomited once and then had 2 episodes of diarrhea.  She did not experience any heart palpitations or shortness of breath.  EMS report that she was hypotensive initially, gave her fluids with improvement of her blood pressure.  Patient reports minimal discomfort currently, symptoms gradually went away without intervention.     HPI: A 43 year old patient presents for evaluation of chest pain. Initial onset of pain was approximately 1-3 hours ago. The patient's chest pain is not worse with exertion. The patient complains of nausea and reports some diaphoresis. The patient's chest pain is middle- or left-sided, is not well-localized, is not described as heaviness/pressure/tightness, is not sharp and does not radiate to the arms/jaw/neck. The patient has no history of stroke, has no history of peripheral artery disease, has not smoked in the past 90 days, denies any history of treated diabetes, has no relevant family history of coronary artery disease (first degree relative at less than age 35), is not hypertensive, has no history of hypercholesterolemia and does not have an elevated BMI (>=30).   Past Medical History:  Diagnosis Date  . ADHD   . Anxiety   . Bipolar disorder ([place]) 2008  . History of abnormal Pap smear 1/01   mild dysplasia, HPV  . Infertility     Patient Active Problem List   Diagnosis Date Noted  . Memory loss 10/04/2017  .  Depression with anxiety 10/04/2017  . Insomnia 10/04/2017  . 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 Weakness of both legs 10/04/2017  . Neck pain 10/04/2017  . Cesarean delivery delivered 02/04/2016  . Postpartum care following cesarean delivery (8/4) 02/04/2016  . Preeclampsia 02/04/2016  . Preeclampsia in postpartum period 02/04/2016    Past Surgical History:  Procedure Laterality Date  . AUGMENTATION MAMMAPLASTY Bilateral 6/07   implants  . CESAREAN SECTION N/A 02/04/2016   Procedure: CESAREAN SECTION;  Surgeon: Servando Salina, MD;  Location: [facility];  Service: Obstetrics;  Laterality: N/A;  . IVF Transfer 06/07/2015 N/A 06/07/2015  . THROAT SURGERY  1998   throat absess  . TONSILLECTOMY  1998     OB History    Gravida  2   Para  1   Term  1   Preterm      AB  1   Living  1     SAB  1   TAB      Ectopic      Multiple  0   Live Births  1           Family History  Problem Relation Age of Onset  . Hyperlipidemia Mother   . COPD Mother   . Esophageal cancer Maternal Aunt 52       esophageal, smoker  . Diabetes Maternal Grandfather   . Cancer Paternal Grandmother        melanoma  . Dementia Paternal Grandmother   .  Lung cancer Paternal Grandfather        lung    Social History   Tobacco Use  . Smoking status: Never Smoker  . Smokeless tobacco: Never Used  Substance Use Topics  . Alcohol use: Yes    Comment: social  . Drug use: No    Home Medications Prior to Admission medications   Medication Sig Start Date End Date Taking? Authorizing Provider  amphetamine-dextroamphetamine (ADDERALL) 20 MG tablet Take 20 mg by mouth daily.    Yes [provider]  ibuprofen (ADVIL) 200 MG tablet Take 400 mg by mouth every 6 (six) hours as needed for headache or mild pain.   Yes [provider]  lisdexamfetamine (VYVANSE) 60 MG capsule Take 60 mg by mouth every morning.   Yes [provider]  QUEtiapine (SEROQUEL) 100 MG  tablet Take 150 mg by mouth at bedtime.   Yes [provider]  sertraline (ZOLOFT) 100 MG tablet Take 200 mg by mouth at bedtime.    Yes [provider]  temazepam (RESTORIL) 30 MG capsule Take 30 mg by mouth at bedtime.    Yes [provider]  topiramate (TOPAMAX) 50 MG tablet Take 50 mg by mouth at bedtime.   Yes [provider]  hydrOXYzine (ATARAX/VISTARIL) 25 MG tablet Take 1 tablet (25 mg total) by mouth every 6 (six) hours as needed for anxiety (mild anxiety). Patient not taking: Reported on 06/22/2019 02/02/17   Duffy Bruce, MD  ibuprofen (ADVIL,MOTRIN) 600 MG tablet Take 1 tablet (600 mg total) by mouth every 6 (six) hours as needed for mild pain. Patient not taking: Reported on 06/22/2019 02/07/16   Artelia Laroche, CNM  LORazepam (ATIVAN) 1 MG tablet Take 1 tablet (1 mg total) by mouth every 8 (eight) hours as needed for anxiety. Patient not taking: Reported on 06/22/2019 10/02/17   Okey Regal, PA-C  oxyCODONE (OXY IR/ROXICODONE) 5 MG immediate release tablet Take 1 tablet (5 mg total) by mouth every 4 (four) hours as needed for severe pain. Patient not taking: Reported on 06/22/2019 08/09/16   Julianne Rice, MD  Vitamin D, Ergocalciferol, (DRISDOL) 50000 units CAPS capsule Take 1 capsule (50,000 Units total) by mouth every 7 (seven) days. Patient not taking: Reported on 06/22/2019 10/08/17   Britt Bottom, MD    Allergies    Penicillins and Phenergan [promethazine hcl]  Review of Systems   Review of Systems  Cardiovascular: Positive for chest pain.  Gastrointestinal: Positive for diarrhea, nausea and vomiting.  All other systems reviewed and are negative.   Physical Exam Updated Vital Signs BP (!) 103/53   Pulse 62   Resp 15   Ht 5\' 5"  (1.651 m)   Wt 65.8 kg   SpO2 98%   BMI 24.13 kg/m   Physical Exam Vitals and nursing note reviewed.  Constitutional:      General: She is not in acute distress.    Appearance: Normal  appearance. She is well-developed.  HENT:     Head: Normocephalic and atraumatic.     Right Ear: Hearing normal.     Left Ear: Hearing normal.     Nose: Nose normal.  Eyes:     Conjunctiva/sclera: Conjunctivae normal.     Pupils: Pupils are equal, round, and reactive to light.  Cardiovascular:     Rate and Rhythm: Regular rhythm.     Heart sounds: S1 normal and S2 normal. No murmur. No friction rub. No gallop.   Pulmonary:  Effort: Pulmonary effort is normal. No respiratory distress.     Breath sounds: Normal breath sounds.  Chest:     Chest wall: No tenderness.  Abdominal:     General: Bowel sounds are normal.     Palpations: Abdomen is soft.     Tenderness: There is no abdominal tenderness. There is no guarding or rebound. Negative signs include Murphy's sign and McBurney's sign.     Hernia: No hernia is present.  Musculoskeletal:        General: Normal range of motion.     Cervical back: Normal range of motion and neck supple.  Skin:    General: Skin is warm and dry.     Findings: No rash.  Neurological:     Mental Status: She is alert and oriented to person, place, and time.     GCS: GCS eye subscore is 4. GCS verbal subscore is 5. GCS motor subscore is 6.     Cranial Nerves: No cranial nerve deficit.     Sensory: No sensory deficit.     Coordination: Coordination normal.  Psychiatric:        Speech: Speech normal.        Behavior: Behavior normal.        Thought Content: Thought content normal.     ED Results / Procedures / Treatments   Labs (all labs ordered are listed, but only abnormal results are displayed) Labs Reviewed  CBC WITH DIFFERENTIAL/PLATELET - Abnormal; Notable for the following components:      Result Value   WBC 11.3 (*)    Neutro Abs 9.1 (*)    All other components within normal limits  COMPREHENSIVE METABOLIC PANEL - Abnormal; Notable for the following components:   Total Protein 6.0 (*)    AST 14 (*)    All other components within  normal limits  LIPASE, BLOOD  URINALYSIS, ROUTINE W REFLEX MICROSCOPIC  D-DIMER, QUANTITATIVE (NOT AT [facility])  POC SARS CORONAVIRUS 2 AG -  ED  TROPONIN I (HIGH SENSITIVITY)  TROPONIN I (HIGH SENSITIVITY)    EKG EKG Interpretation  Date/Time:  Sunday June 22 2019 04:21:49 EST Ventricular Rate:  62 PR Interval:    QRS Duration: 74 QT Interval:  433 QTC Calculation: 440 R Axis:   87 Text Interpretation: Sinus rhythm Nonspecific T abnrm, anterolateral leads ST elev, probable normal early repol pattern Confirmed by Orpah Greek [phone]) on 06/22/2019 4:29:26 AM   Radiology DG Chest Port 1 View  Result Date: 06/22/2019 CLINICAL DATA:  Initial evaluation for acute chest pain. EXAM: PORTABLE CHEST 1 VIEW COMPARISON:  Prior radiograph from 10/02/2017. FINDINGS: The cardiac and mediastinal silhouettes are stable in size and contour, and remain within normal limits. The lungs are normally inflated. No airspace consolidation, pleural effusion, or pulmonary edema. No pneumothorax. No acute osseous abnormality. IMPRESSION: No active cardiopulmonary disease. Electronically Signed   By: Jeannine Boga M.D.   On: 06/22/2019 05:44    Procedures Procedures (including critical care time)  Medications Ordered in ED Medications  alum & mag hydroxide-simeth (MAALOX/MYLANTA) 200-200-20 MG/5ML suspension 30 mL (has no administration in time range)    And  lidocaine (XYLOCAINE) 2 % viscous mouth solution 15 mL (has no administration in time range)    ED Course  I have reviewed the triage vital signs and the nursing notes.  Pertinent labs & imaging results that were available during my care of the patient were reviewed by me and considered in my medical decision making (  see chart for details).    MDM Rules/Calculators/A&P HEAR Score: 2                    Patient presents to the emergency department for evaluation of chest discomfort.  She had an episode of chest pain that has  slowly resolved and it was followed by nausea, vomiting and diarrhea.  She had some diaphoresis and hypotension when EMS arrived.  This sounds suspicious for possible vagal response.  Patient's abdominal exam is benign.  Cardiac evaluation ongoing.  EKG is not suspicious.  Troponin negative.  Will sign out to oncoming ER physician to follow-up second troponin and disposition patient. Final Clinical Impression(s) / ED Diagnoses Final diagnoses:  Chest pain, unspecified type    Rx / DC Orders ED Discharge Orders    None       Orpah Greek, MD 06/22/19 [phone]

## 2019-06-22 NOTE — ED Triage Notes (Signed)
Pt in via [facility] with 6/10 chest tightness that woke her up at 0215 this am. EMS states pt was 88/40 and diaphoretic initially, given 500 ml's NS, repeat 110/70. 324 mg ASA and 4 mg Zofran also given en route. Emesis x 1 and diarrhea x 2 PTA. CBG 128, denies any sob, cp now 4/10

## 2019-06-22 NOTE — ED Notes (Signed)
Patient verbalizes understanding of discharge instructions . Opportunity for questions and answers were provided . Armband removed by staff ,Pt discharged from ED. W/C  offered at D/C  and Declined W/C at D/C and was escorted to lobby by RN.  

## 2019-06-23 LAB — NOVEL CORONAVIRUS, NAA (HOSP ORDER, SEND-OUT TO REF LAB; TAT 18-24 HRS): SARS-CoV-2, NAA: NOT DETECTED

## 2019-06-24 DIAGNOSIS — F900 Attention-deficit hyperactivity disorder, predominantly inattentive type: Secondary | ICD-10-CM | POA: Diagnosis not present

## 2019-06-24 DIAGNOSIS — F331 Major depressive disorder, recurrent, moderate: Secondary | ICD-10-CM | POA: Diagnosis not present

## 2019-06-24 DIAGNOSIS — F411 Generalized anxiety disorder: Secondary | ICD-10-CM | POA: Diagnosis not present

## 2019-06-27 IMAGING — CR DG CHEST 2V
2 series · 2 of 2 positions shown · non-contrast
Comparison: None.

CLINICAL DATA: Shortness of breath and chest pain. Cough for 4
days.

EXAM:
CHEST - 2 VIEW

[chest pa]
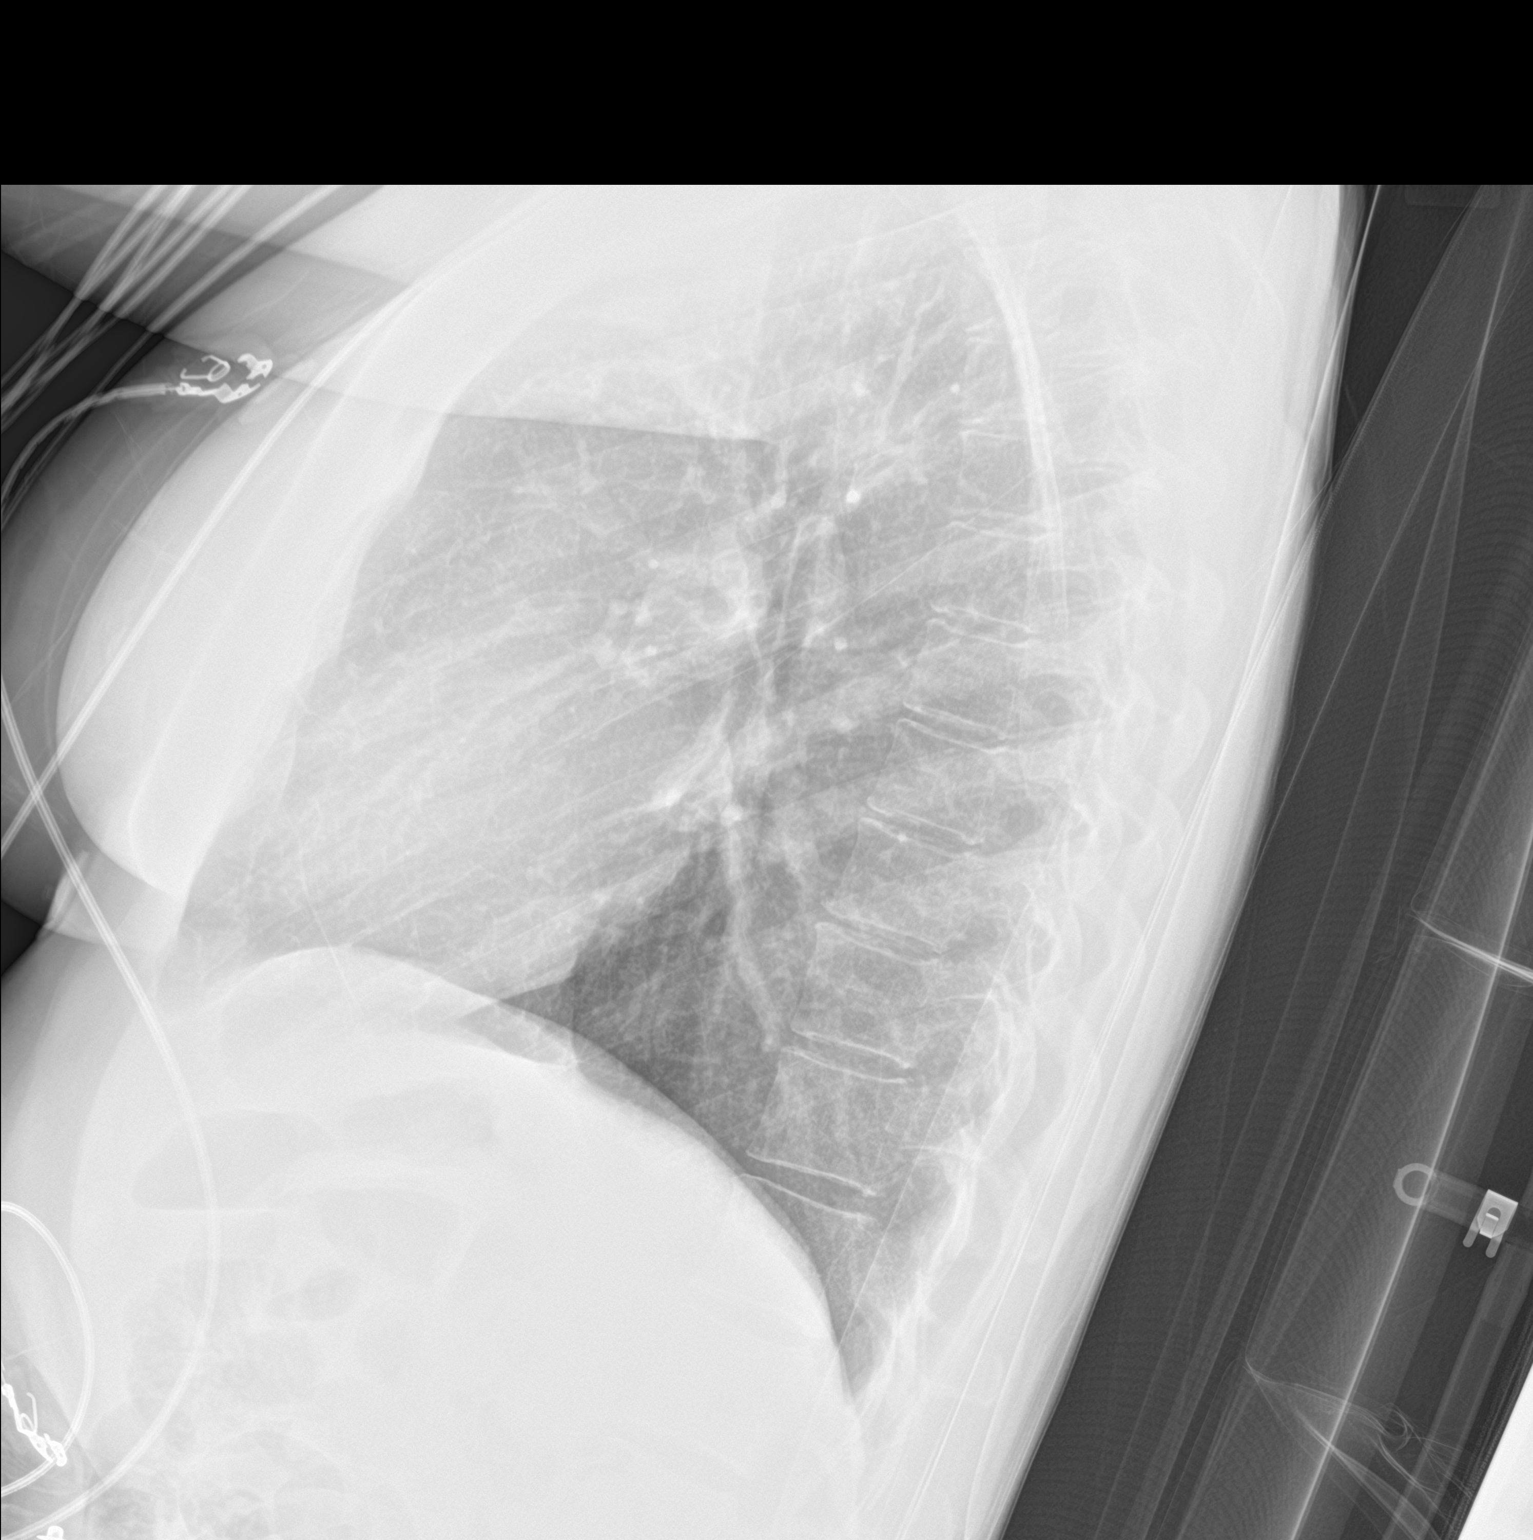

[chest ap]
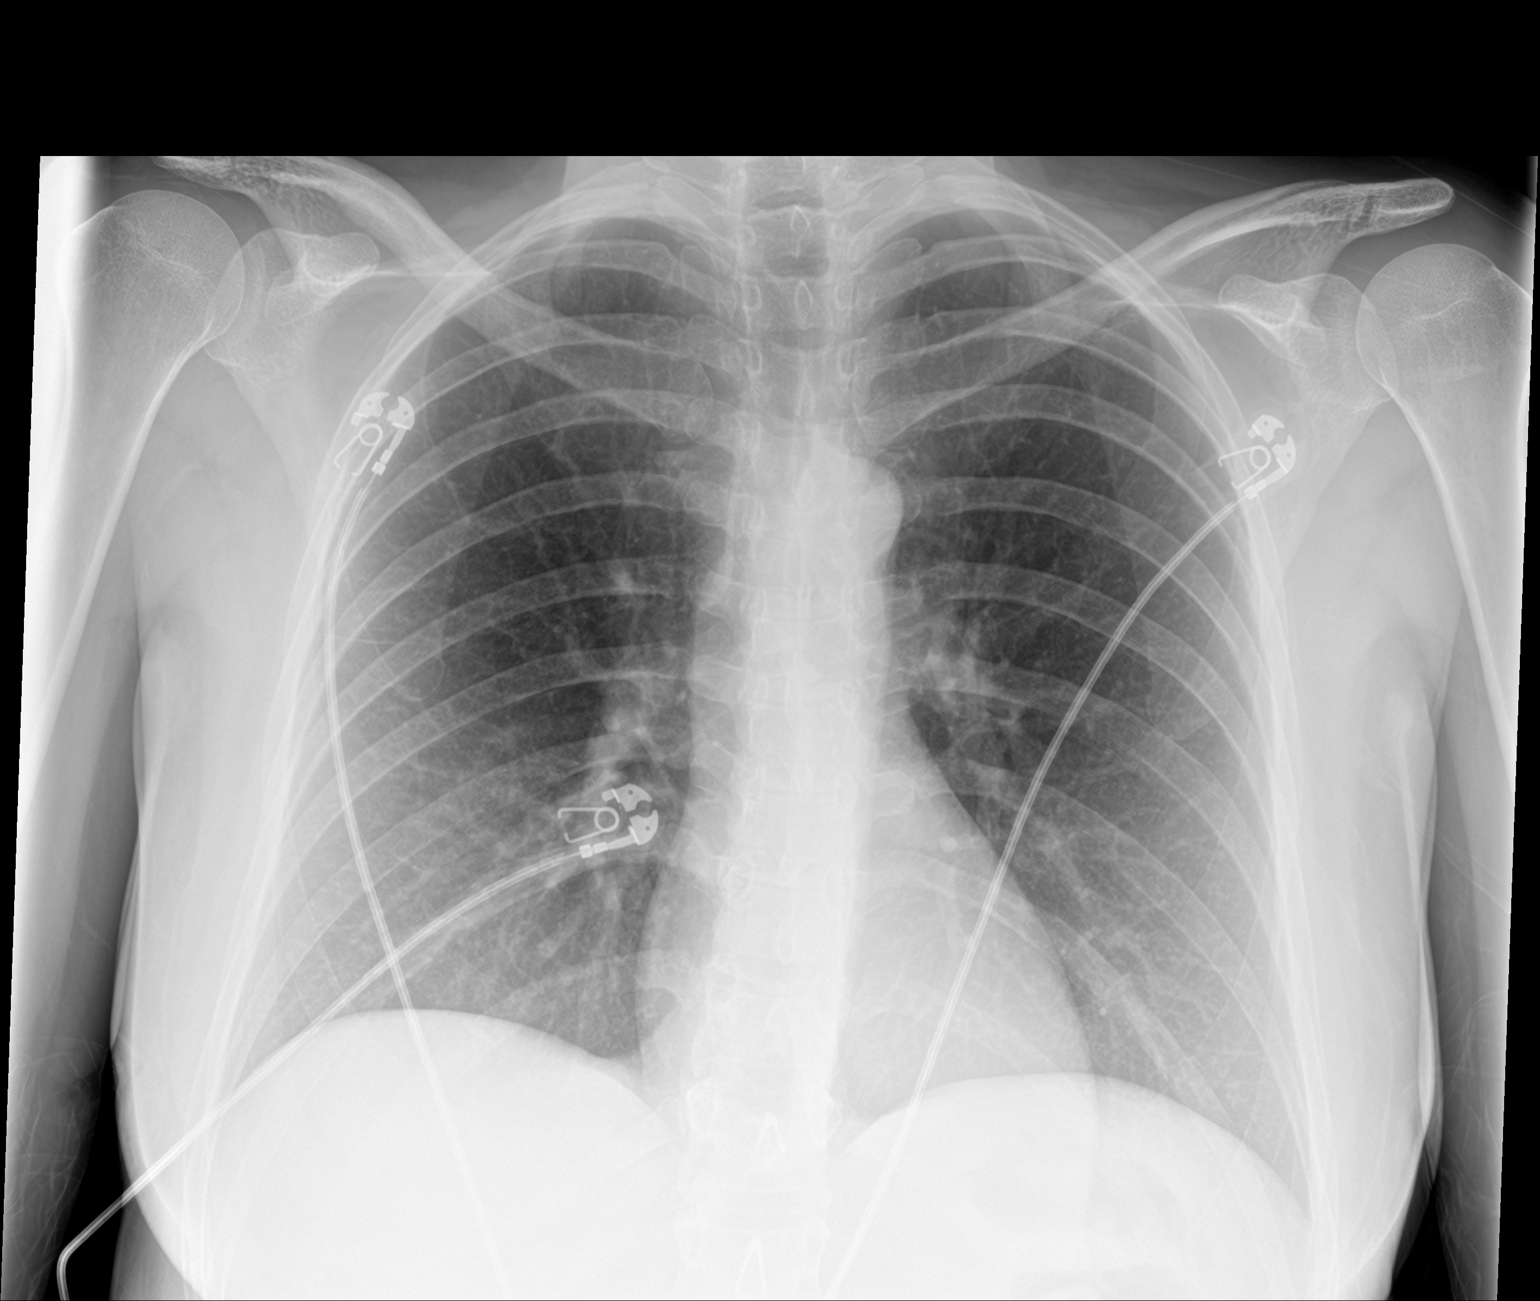

[2 of 2 positions shown; findings below may reference images not displayed]

FINDINGS: Lungs are clear. Heart size and pulmonary vascularity are normal. No
adenopathy. No pneumothorax. No bone lesions.
IMPRESSION: No edema or consolidation.

## 2019-06-27 IMAGING — CT CT ANGIO CHEST
2 of 8 series · 18 of 46 positions shown · IV contrast (OMNI)
Comparison: Chest radiograph from earlier today.

CLINICAL DATA: Dyspnea. Bilateral lower and upper extremity
numbness and tingling.

EXAM:
CT ANGIOGRAPHY CHEST WITH CONTRAST
TECHNIQUE: Multidetector CT imaging of the chest was performed using the
standard protocol during bolus administration of intravenous
contrast. Multiplanar CT image reconstructions and MIPs were
obtained to evaluate the vascular anatomy.
CONTRAST:  100mL BU6FZJ-D0O IOPAMIDOL (BU6FZJ-D0O) INJECTION 76%

[Series 6: thins · axial · 0.53mm/px · z∈[+1115,+1335]mm · 15 of 243 slices shown]
[im 12/243  lung]
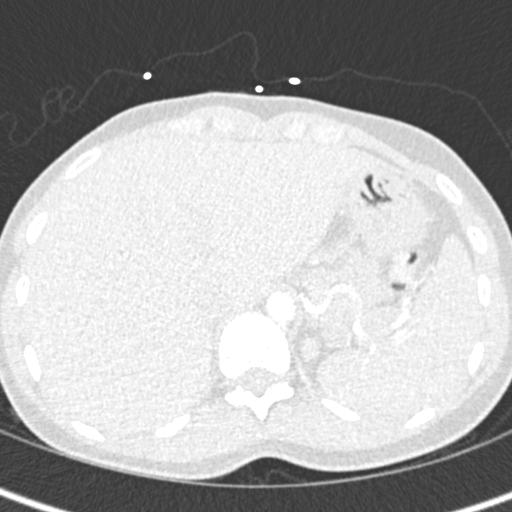
[im 34/243  soft-tissue]
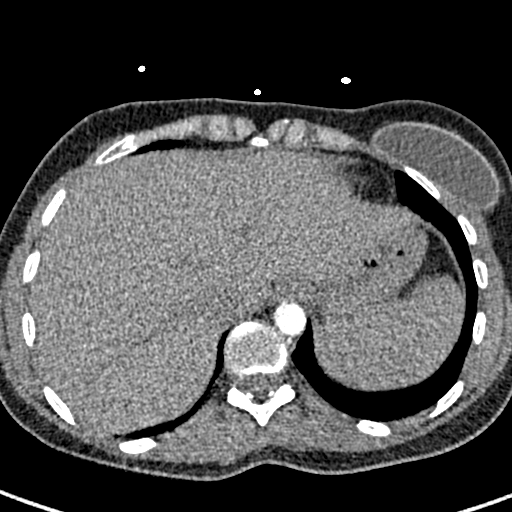
[im 45/243  lung]
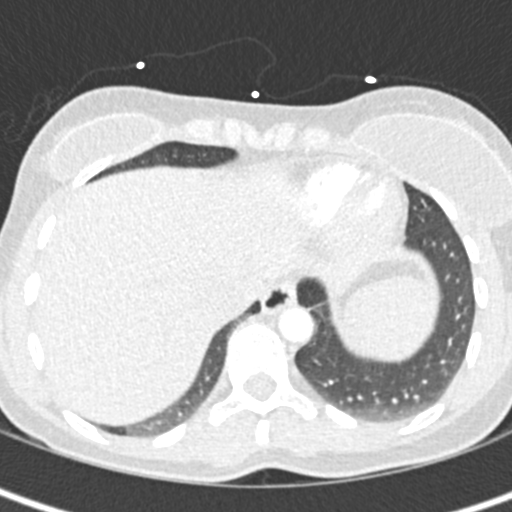
[im 56/243  soft-tissue]
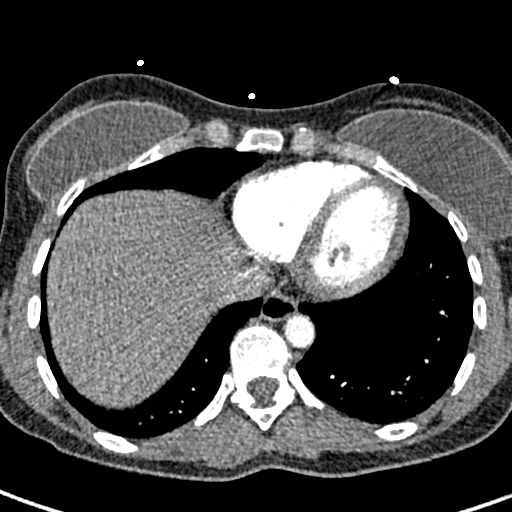
[im 78/243  lung]
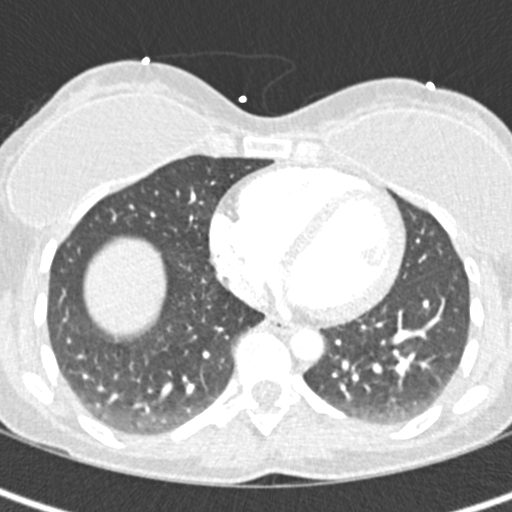
[im 89/243  soft-tissue]
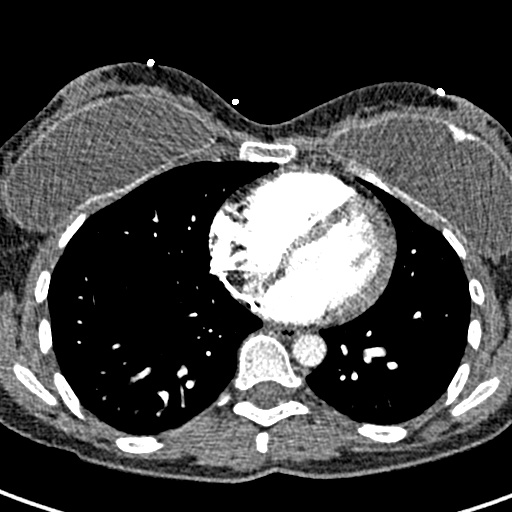
[im 111/243  lung]
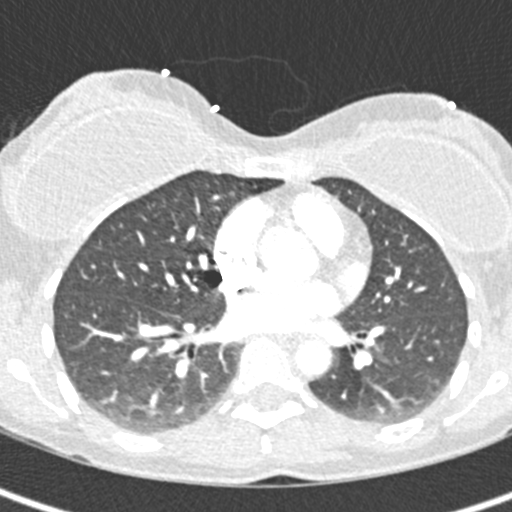
[im 122/243  soft-tissue]
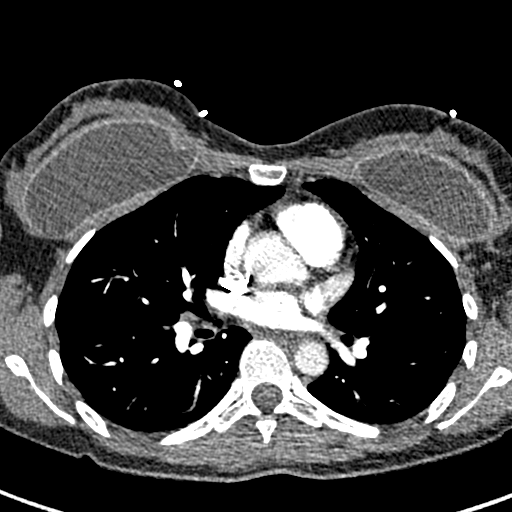
[im 133/243  lung]
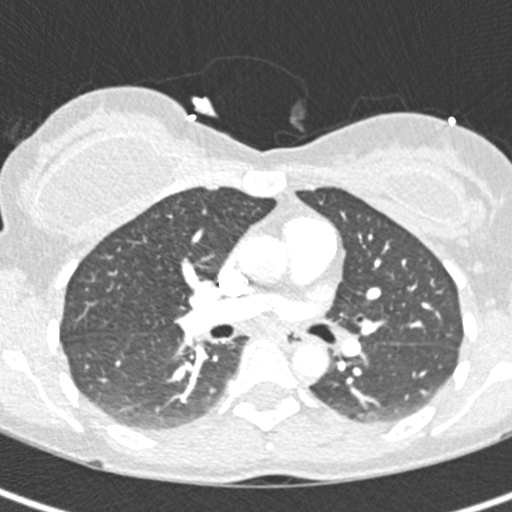
[im 155/243  soft-tissue]
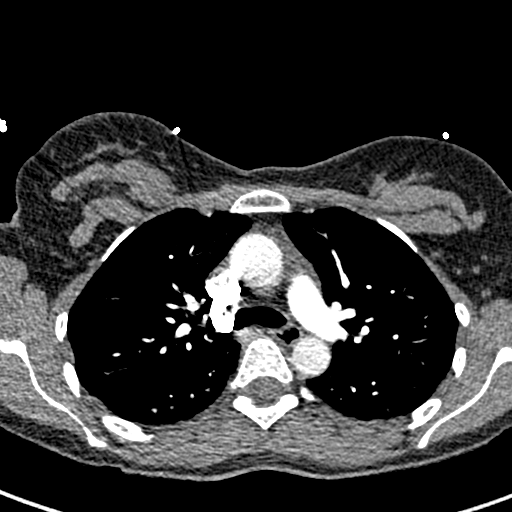
[im 166/243  lung]
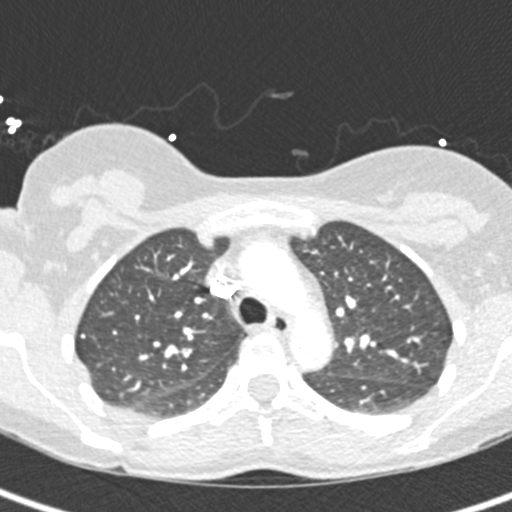
[im 188/243  soft-tissue]
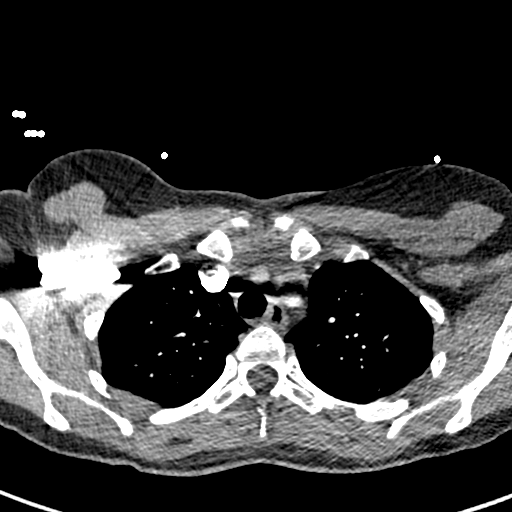
[im 199/243  lung]
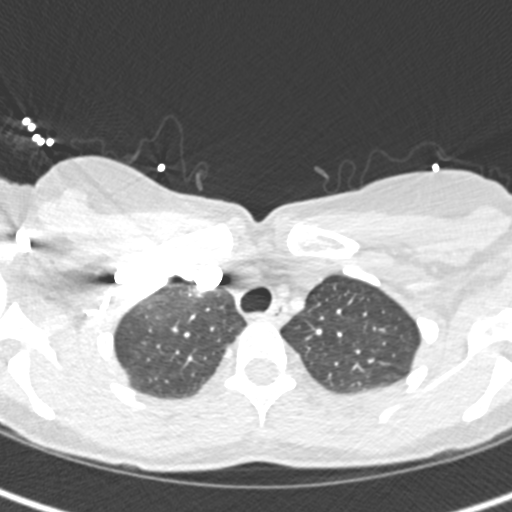
[im 210/243  soft-tissue]
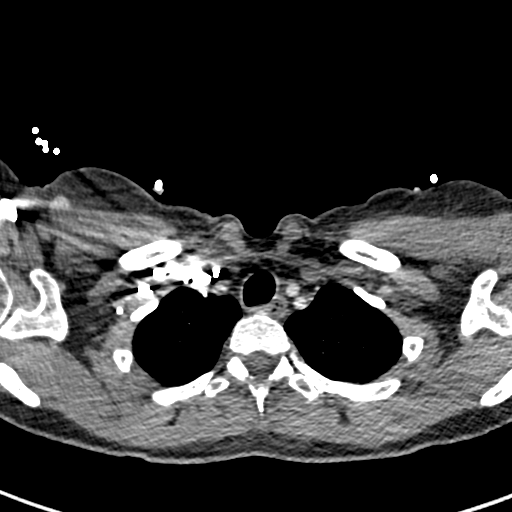
[im 232/243  lung]
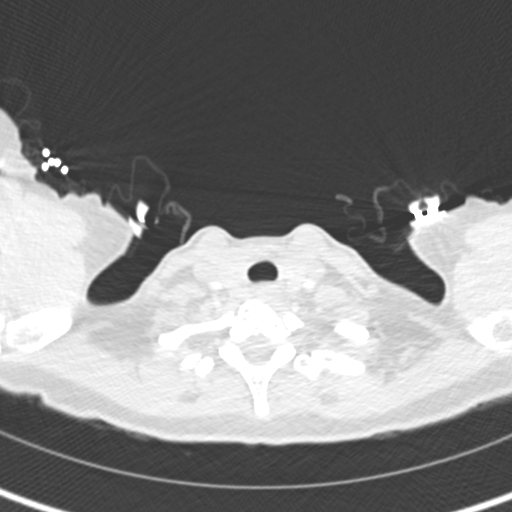

[Series 8: coronal mpr · coronal · 0.48mm/px · 3 of 93 slices shown]
[im 24/93  soft-tissue]
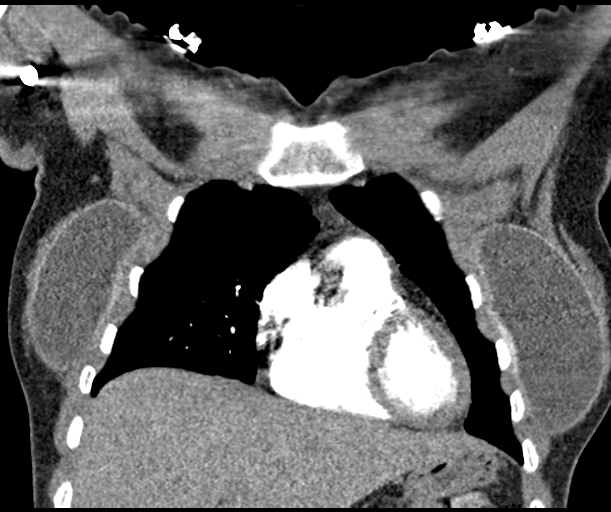
[im 47/93  soft-tissue]
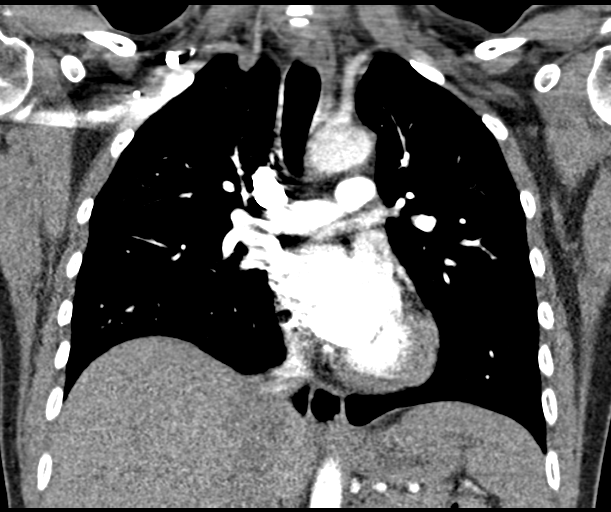
[im 70/93  soft-tissue]
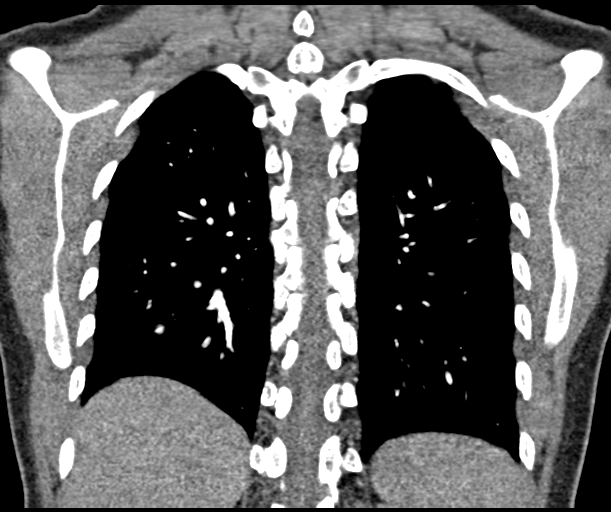

[18 of 46 positions shown; findings below may reference images not displayed]

FINDINGS: Cardiovascular: The study is high quality for the evaluation of
pulmonary embolism. There are no filling defects in the central,
lobar, segmental or subsegmental pulmonary artery branches to
suggest acute pulmonary embolism. Great vessels are normal in course
and caliber. Normal heart size. No significant pericardial
fluid/thickening.

Mediastinum/Nodes: No discrete thyroid nodules. Unremarkable
esophagus. No pathologically enlarged axillary, mediastinal or hilar
lymph nodes.

Lungs/Pleura: No pneumothorax. No pleural effusion. No acute
consolidative airspace disease, lung masses or significant pulmonary
nodules. Two tiny faintly calcified 2 mm granulomas are present in
the right lung.

Upper abdomen: No acute abnormality.

Musculoskeletal: No aggressive appearing focal osseous lesions.
Intact appearing partially visualized bilateral breast prostheses.

Review of the MIP images confirms the above findings.
IMPRESSION: No pulmonary embolism.  No active pulmonary disease.

## 2019-10-13 ENCOUNTER — Ambulatory Visit: Payer: BC Managed Care – PPO | Admitting: Family Medicine

## 2019-10-13 DIAGNOSIS — Z0289 Encounter for other administrative examinations: Secondary | ICD-10-CM

## 2019-10-16 DIAGNOSIS — Z79899 Other long term (current) drug therapy: Secondary | ICD-10-CM | POA: Diagnosis not present

## 2019-12-10 DIAGNOSIS — F411 Generalized anxiety disorder: Secondary | ICD-10-CM | POA: Diagnosis not present

## 2019-12-10 DIAGNOSIS — E559 Vitamin D deficiency, unspecified: Secondary | ICD-10-CM | POA: Diagnosis not present

## 2019-12-10 DIAGNOSIS — F900 Attention-deficit hyperactivity disorder, predominantly inattentive type: Secondary | ICD-10-CM | POA: Diagnosis not present

## 2019-12-10 DIAGNOSIS — F339 Major depressive disorder, recurrent, unspecified: Secondary | ICD-10-CM | POA: Diagnosis not present

## 2020-01-06 DIAGNOSIS — F331 Major depressive disorder, recurrent, moderate: Secondary | ICD-10-CM | POA: Diagnosis not present

## 2020-01-20 DIAGNOSIS — F331 Major depressive disorder, recurrent, moderate: Secondary | ICD-10-CM | POA: Diagnosis not present

## 2020-02-03 DIAGNOSIS — F331 Major depressive disorder, recurrent, moderate: Secondary | ICD-10-CM | POA: Diagnosis not present

## 2020-02-19 DIAGNOSIS — F331 Major depressive disorder, recurrent, moderate: Secondary | ICD-10-CM | POA: Diagnosis not present

## 2020-03-04 DIAGNOSIS — F331 Major depressive disorder, recurrent, moderate: Secondary | ICD-10-CM | POA: Diagnosis not present

## 2020-03-18 DIAGNOSIS — F331 Major depressive disorder, recurrent, moderate: Secondary | ICD-10-CM | POA: Diagnosis not present

## 2020-03-26 DIAGNOSIS — R220 Localized swelling, mass and lump, head: Secondary | ICD-10-CM | POA: Diagnosis not present

## 2020-03-26 DIAGNOSIS — S0990XA Unspecified injury of head, initial encounter: Secondary | ICD-10-CM | POA: Diagnosis not present

## 2020-03-26 DIAGNOSIS — F329 Major depressive disorder, single episode, unspecified: Secondary | ICD-10-CM | POA: Diagnosis not present

## 2020-03-26 DIAGNOSIS — R55 Syncope and collapse: Secondary | ICD-10-CM | POA: Diagnosis not present

## 2020-03-26 DIAGNOSIS — Z880 Allergy status to penicillin: Secondary | ICD-10-CM | POA: Diagnosis not present

## 2020-03-26 DIAGNOSIS — W1839XA Other fall on same level, initial encounter: Secondary | ICD-10-CM | POA: Diagnosis not present

## 2020-03-26 DIAGNOSIS — S0083XA Contusion of other part of head, initial encounter: Secondary | ICD-10-CM | POA: Diagnosis not present

## 2020-03-26 DIAGNOSIS — R079 Chest pain, unspecified: Secondary | ICD-10-CM | POA: Diagnosis not present

## 2020-03-26 DIAGNOSIS — W2209XA Striking against other stationary object, initial encounter: Secondary | ICD-10-CM | POA: Diagnosis not present

## 2020-03-26 DIAGNOSIS — Z888 Allergy status to other drugs, medicaments and biological substances status: Secondary | ICD-10-CM | POA: Diagnosis not present

## 2020-03-26 DIAGNOSIS — Z79899 Other long term (current) drug therapy: Secondary | ICD-10-CM | POA: Diagnosis not present

## 2020-03-28 DIAGNOSIS — R001 Bradycardia, unspecified: Secondary | ICD-10-CM | POA: Diagnosis not present

## 2020-03-29 DIAGNOSIS — F331 Major depressive disorder, recurrent, moderate: Secondary | ICD-10-CM | POA: Diagnosis not present

## 2020-03-29 DIAGNOSIS — H6121 Impacted cerumen, right ear: Secondary | ICD-10-CM | POA: Diagnosis not present

## 2020-03-29 DIAGNOSIS — H60311 Diffuse otitis externa, right ear: Secondary | ICD-10-CM | POA: Diagnosis not present

## 2020-03-29 DIAGNOSIS — F0781 Postconcussional syndrome: Secondary | ICD-10-CM | POA: Diagnosis not present

## 2020-04-03 DIAGNOSIS — F339 Major depressive disorder, recurrent, unspecified: Secondary | ICD-10-CM | POA: Diagnosis not present

## 2020-04-03 DIAGNOSIS — E559 Vitamin D deficiency, unspecified: Secondary | ICD-10-CM | POA: Diagnosis not present

## 2020-04-03 DIAGNOSIS — F900 Attention-deficit hyperactivity disorder, predominantly inattentive type: Secondary | ICD-10-CM | POA: Diagnosis not present

## 2020-04-03 DIAGNOSIS — F411 Generalized anxiety disorder: Secondary | ICD-10-CM | POA: Diagnosis not present

## 2020-06-05 DIAGNOSIS — F900 Attention-deficit hyperactivity disorder, predominantly inattentive type: Secondary | ICD-10-CM | POA: Diagnosis not present

## 2020-06-05 DIAGNOSIS — F411 Generalized anxiety disorder: Secondary | ICD-10-CM | POA: Diagnosis not present

## 2020-06-05 DIAGNOSIS — F339 Major depressive disorder, recurrent, unspecified: Secondary | ICD-10-CM | POA: Diagnosis not present

## 2020-07-17 DIAGNOSIS — F331 Major depressive disorder, recurrent, moderate: Secondary | ICD-10-CM | POA: Diagnosis not present

## 2020-07-23 DIAGNOSIS — R5383 Other fatigue: Secondary | ICD-10-CM | POA: Diagnosis not present

## 2020-07-23 DIAGNOSIS — Z130 Encounter for screening for diseases of the blood and blood-forming organs and certain disorders involving the immune mechanism: Secondary | ICD-10-CM | POA: Diagnosis not present

## 2020-07-23 DIAGNOSIS — Z0000 Encounter for general adult medical examination without abnormal findings: Secondary | ICD-10-CM | POA: Diagnosis not present

## 2020-07-23 DIAGNOSIS — Z1231 Encounter for screening mammogram for malignant neoplasm of breast: Secondary | ICD-10-CM | POA: Diagnosis not present

## 2020-07-23 DIAGNOSIS — Z13220 Encounter for screening for lipoid disorders: Secondary | ICD-10-CM | POA: Diagnosis not present

## 2020-07-23 DIAGNOSIS — F321 Major depressive disorder, single episode, moderate: Secondary | ICD-10-CM | POA: Diagnosis not present

## 2020-07-23 DIAGNOSIS — Z124 Encounter for screening for malignant neoplasm of cervix: Secondary | ICD-10-CM | POA: Diagnosis not present

## 2020-08-10 DIAGNOSIS — F331 Major depressive disorder, recurrent, moderate: Secondary | ICD-10-CM | POA: Diagnosis not present

## 2020-08-17 DIAGNOSIS — Z1231 Encounter for screening mammogram for malignant neoplasm of breast: Secondary | ICD-10-CM | POA: Diagnosis not present

## 2020-08-19 DIAGNOSIS — Z1211 Encounter for screening for malignant neoplasm of colon: Secondary | ICD-10-CM | POA: Diagnosis not present

## 2020-08-19 DIAGNOSIS — K640 First degree hemorrhoids: Secondary | ICD-10-CM | POA: Diagnosis not present

## 2020-08-23 DIAGNOSIS — Z3202 Encounter for pregnancy test, result negative: Secondary | ICD-10-CM | POA: Diagnosis not present

## 2020-08-23 DIAGNOSIS — B977 Papillomavirus as the cause of diseases classified elsewhere: Secondary | ICD-10-CM | POA: Diagnosis not present

## 2020-08-23 DIAGNOSIS — Z6824 Body mass index (BMI) 24.0-24.9, adult: Secondary | ICD-10-CM | POA: Diagnosis not present

## 2020-08-23 DIAGNOSIS — N72 Inflammatory disease of cervix uteri: Secondary | ICD-10-CM | POA: Diagnosis not present

## 2020-08-23 DIAGNOSIS — R87810 Cervical high risk human papillomavirus (HPV) DNA test positive: Secondary | ICD-10-CM | POA: Diagnosis not present

## 2020-08-31 DIAGNOSIS — F331 Major depressive disorder, recurrent, moderate: Secondary | ICD-10-CM | POA: Diagnosis not present

## 2020-09-14 DIAGNOSIS — F331 Major depressive disorder, recurrent, moderate: Secondary | ICD-10-CM | POA: Diagnosis not present

## 2020-09-28 DIAGNOSIS — F331 Major depressive disorder, recurrent, moderate: Secondary | ICD-10-CM | POA: Diagnosis not present

## 2020-09-30 DIAGNOSIS — M2011 Hallux valgus (acquired), right foot: Secondary | ICD-10-CM | POA: Diagnosis not present

## 2020-09-30 DIAGNOSIS — M7741 Metatarsalgia, right foot: Secondary | ICD-10-CM | POA: Diagnosis not present

## 2020-09-30 DIAGNOSIS — M2012 Hallux valgus (acquired), left foot: Secondary | ICD-10-CM | POA: Diagnosis not present

## 2020-09-30 DIAGNOSIS — M21612 Bunion of left foot: Secondary | ICD-10-CM | POA: Diagnosis not present

## 2020-09-30 DIAGNOSIS — M21611 Bunion of right foot: Secondary | ICD-10-CM | POA: Diagnosis not present

## 2020-09-30 DIAGNOSIS — M7742 Metatarsalgia, left foot: Secondary | ICD-10-CM | POA: Diagnosis not present

## 2020-10-20 DIAGNOSIS — E782 Mixed hyperlipidemia: Secondary | ICD-10-CM | POA: Diagnosis not present

## 2020-10-20 DIAGNOSIS — F325 Major depressive disorder, single episode, in full remission: Secondary | ICD-10-CM | POA: Diagnosis not present

## 2020-10-20 DIAGNOSIS — M2012 Hallux valgus (acquired), left foot: Secondary | ICD-10-CM | POA: Diagnosis not present

## 2020-10-20 DIAGNOSIS — F988 Other specified behavioral and emotional disorders with onset usually occurring in childhood and adolescence: Secondary | ICD-10-CM | POA: Diagnosis not present

## 2020-10-21 DIAGNOSIS — I959 Hypotension, unspecified: Secondary | ICD-10-CM | POA: Diagnosis not present

## 2020-10-21 DIAGNOSIS — R0902 Hypoxemia: Secondary | ICD-10-CM | POA: Diagnosis not present

## 2020-10-21 DIAGNOSIS — R52 Pain, unspecified: Secondary | ICD-10-CM | POA: Diagnosis not present

## 2020-10-21 DIAGNOSIS — R1084 Generalized abdominal pain: Secondary | ICD-10-CM | POA: Diagnosis not present

## 2020-10-22 DIAGNOSIS — R1084 Generalized abdominal pain: Secondary | ICD-10-CM | POA: Diagnosis not present

## 2020-10-22 DIAGNOSIS — Z888 Allergy status to other drugs, medicaments and biological substances status: Secondary | ICD-10-CM | POA: Diagnosis not present

## 2020-10-22 DIAGNOSIS — Z79899 Other long term (current) drug therapy: Secondary | ICD-10-CM | POA: Diagnosis not present

## 2020-10-22 DIAGNOSIS — K759 Inflammatory liver disease, unspecified: Secondary | ICD-10-CM | POA: Diagnosis not present

## 2020-10-22 DIAGNOSIS — R0602 Shortness of breath: Secondary | ICD-10-CM | POA: Diagnosis not present

## 2020-10-22 DIAGNOSIS — K6389 Other specified diseases of intestine: Secondary | ICD-10-CM | POA: Diagnosis not present

## 2020-10-22 DIAGNOSIS — Z880 Allergy status to penicillin: Secondary | ICD-10-CM | POA: Diagnosis not present

## 2020-10-22 DIAGNOSIS — R42 Dizziness and giddiness: Secondary | ICD-10-CM | POA: Diagnosis not present

## 2020-10-22 DIAGNOSIS — R197 Diarrhea, unspecified: Secondary | ICD-10-CM | POA: Diagnosis not present

## 2020-10-22 DIAGNOSIS — D739 Disease of spleen, unspecified: Secondary | ICD-10-CM | POA: Diagnosis not present

## 2020-10-22 DIAGNOSIS — I208 Other forms of angina pectoris: Secondary | ICD-10-CM | POA: Diagnosis not present

## 2020-10-22 DIAGNOSIS — R7989 Other specified abnormal findings of blood chemistry: Secondary | ICD-10-CM | POA: Diagnosis not present

## 2020-10-22 DIAGNOSIS — Z20822 Contact with and (suspected) exposure to covid-19: Secondary | ICD-10-CM | POA: Diagnosis not present

## 2020-10-22 DIAGNOSIS — R112 Nausea with vomiting, unspecified: Secondary | ICD-10-CM | POA: Diagnosis not present

## 2020-10-25 DIAGNOSIS — Z202 Contact with and (suspected) exposure to infections with a predominantly sexual mode of transmission: Secondary | ICD-10-CM | POA: Diagnosis not present

## 2020-10-25 DIAGNOSIS — Z6825 Body mass index (BMI) 25.0-25.9, adult: Secondary | ICD-10-CM | POA: Diagnosis not present

## 2020-10-25 DIAGNOSIS — Z113 Encounter for screening for infections with a predominantly sexual mode of transmission: Secondary | ICD-10-CM | POA: Diagnosis not present

## 2020-10-25 DIAGNOSIS — N9089 Other specified noninflammatory disorders of vulva and perineum: Secondary | ICD-10-CM | POA: Diagnosis not present

## 2020-10-25 DIAGNOSIS — N951 Menopausal and female climacteric states: Secondary | ICD-10-CM | POA: Diagnosis not present

## 2020-10-27 DIAGNOSIS — R7989 Other specified abnormal findings of blood chemistry: Secondary | ICD-10-CM | POA: Diagnosis not present

## 2020-11-01 DIAGNOSIS — E8801 Alpha-1-antitrypsin deficiency: Secondary | ICD-10-CM | POA: Diagnosis not present

## 2020-11-02 DIAGNOSIS — Z4789 Encounter for other orthopedic aftercare: Secondary | ICD-10-CM | POA: Diagnosis not present

## 2020-11-06 DIAGNOSIS — F900 Attention-deficit hyperactivity disorder, predominantly inattentive type: Secondary | ICD-10-CM | POA: Diagnosis not present

## 2020-11-06 DIAGNOSIS — F411 Generalized anxiety disorder: Secondary | ICD-10-CM | POA: Diagnosis not present

## 2020-11-06 DIAGNOSIS — F331 Major depressive disorder, recurrent, moderate: Secondary | ICD-10-CM | POA: Diagnosis not present

## 2020-11-16 DIAGNOSIS — R748 Abnormal levels of other serum enzymes: Secondary | ICD-10-CM | POA: Diagnosis not present

## 2020-11-26 DIAGNOSIS — Z4789 Encounter for other orthopedic aftercare: Secondary | ICD-10-CM | POA: Diagnosis not present

## 2020-12-01 DIAGNOSIS — T84498D Other mechanical complication of other internal orthopedic devices, implants and grafts, subsequent encounter: Secondary | ICD-10-CM | POA: Diagnosis not present

## 2020-12-28 DIAGNOSIS — K7200 Acute and subacute hepatic failure without coma: Secondary | ICD-10-CM | POA: Diagnosis not present
